# Patient Record
Sex: Male | Born: 1945 | Race: White | Hispanic: No | Marital: Married | Smoking: Former smoker
Health system: Southern US, Community
[De-identification: ages and names within clinical notes are randomized; demographics above are authoritative.]

## PROBLEM LIST (undated history)

## (undated) DIAGNOSIS — R945 Abnormal results of liver function studies: Secondary | ICD-10-CM

## (undated) DIAGNOSIS — E785 Hyperlipidemia, unspecified: Secondary | ICD-10-CM

## (undated) DIAGNOSIS — M542 Cervicalgia: Secondary | ICD-10-CM

## (undated) DIAGNOSIS — S301XXA Contusion of abdominal wall, initial encounter: Secondary | ICD-10-CM

## (undated) DIAGNOSIS — N529 Male erectile dysfunction, unspecified: Secondary | ICD-10-CM

## (undated) DIAGNOSIS — G4733 Obstructive sleep apnea (adult) (pediatric): Secondary | ICD-10-CM

## (undated) DIAGNOSIS — R7989 Other specified abnormal findings of blood chemistry: Secondary | ICD-10-CM

## (undated) DIAGNOSIS — J45909 Unspecified asthma, uncomplicated: Secondary | ICD-10-CM

## (undated) DIAGNOSIS — I701 Atherosclerosis of renal artery: Secondary | ICD-10-CM

## (undated) DIAGNOSIS — I10 Essential (primary) hypertension: Secondary | ICD-10-CM

## (undated) DIAGNOSIS — M1990 Unspecified osteoarthritis, unspecified site: Secondary | ICD-10-CM

## (undated) DIAGNOSIS — S3011XA Contusion of abdominal wall, initial encounter: Secondary | ICD-10-CM

## (undated) HISTORY — PX: RENAL ARTERY STENT: SHX2321

## (undated) HISTORY — DX: Essential (primary) hypertension: I10

## (undated) HISTORY — DX: Contusion of abdominal wall, initial encounter: S30.1XXA

## (undated) HISTORY — DX: Atherosclerosis of renal artery: I70.1

## (undated) HISTORY — DX: Abnormal results of liver function studies: R94.5

## (undated) HISTORY — DX: Contusion of abdominal wall, initial encounter: S30.11XA

## (undated) HISTORY — DX: Unspecified osteoarthritis, unspecified site: M19.90

## (undated) HISTORY — DX: Cervicalgia: M54.2

## (undated) HISTORY — PX: TONSILLECTOMY AND ADENOIDECTOMY: SUR1326

## (undated) HISTORY — DX: Male erectile dysfunction, unspecified: N52.9

## (undated) HISTORY — DX: Other specified abnormal findings of blood chemistry: R79.89

## (undated) HISTORY — DX: Hyperlipidemia, unspecified: E78.5

## (undated) HISTORY — PX: OTHER SURGICAL HISTORY: SHX169

## (undated) HISTORY — DX: Obstructive sleep apnea (adult) (pediatric): G47.33

---

## 2005-09-23 ENCOUNTER — Encounter: Admission: RE | Admit: 2005-09-23 | Discharge: 2005-09-23 | Payer: Self-pay | Admitting: Internal Medicine

## 2006-10-17 ENCOUNTER — Encounter: Admission: RE | Admit: 2006-10-17 | Discharge: 2006-10-17 | Payer: Self-pay | Admitting: Internal Medicine

## 2008-08-09 ENCOUNTER — Ambulatory Visit (HOSPITAL_COMMUNITY): Admission: RE | Admit: 2008-08-09 | Discharge: 2008-08-09 | Payer: Self-pay | Admitting: Emergency Medicine

## 2009-04-02 ENCOUNTER — Ambulatory Visit (HOSPITAL_COMMUNITY): Admission: RE | Admit: 2009-04-02 | Discharge: 2009-04-02 | Payer: Self-pay | Admitting: Interventional Cardiology

## 2010-11-22 LAB — CREATININE, SERUM
Creatinine, Ser: 0.8 mg/dL (ref 0.4–1.5)
GFR calc Af Amer: >60 mL/min (ref >60)
GFR calc non Af Amer: >60 mL/min (ref >60)

## 2010-11-22 LAB — BUN: BUN: 13 mg/dL (ref 6–23)

## 2010-12-21 NOTE — Op Note (Signed)
NAME:  Aaron Kirk, ALA NO.:  [id number]   MEDICAL RECORD NO.:  [id number]          PATIENT TYPE:  AMB   LOCATION:  SDS                          FACILITY:  [facility]   PHYSICIAN:  Corky Crafts, MDDATE OF BIRTH:  [birth year]   DATE OF PROCEDURE:  04/02/2009  DATE OF DISCHARGE:  04/02/2009                               OPERATIVE REPORT   REFERRING PHYSICIAN:  Theressa Millard, MD   PROCEDURES PERFORMED:  Abdominal aortogram, bilateral selective renal  arteriogram, percutaneous transluminal angioplasty/stent of the right  renal artery.   INDICATIONS:  Hypertension, decreasing size of the right kidney.   PROCEDURE NARRATIVE:  The risks and benefits of peripheral angiography  were explained to the patient and informed consent was obtained.  He was  brought to the [facility] lab.  He was prepped and draped in the usual sterile  fashion.  His right groin was infiltrated with 1% lidocaine.  A 6-French  sheath was placed into the right femoral artery using modified Seldinger  technique.  Pigtail catheter was advanced to the abdominal aorta and a  power injection of contrast was performed in the AP projection.  A 6-  [place] IMA guide was used to selectively image the left renal artery  that same guiding catheter was then used to engage the ostium of the  right renal artery.  The right renal artery had an 80% stenosis.  Heparin was given to anticoagulate the patient.  A stabilizer wire was  placed across the lesion.  A 5.0 x 15 Aviator balloon was then inflated  across the lesion to 10 atmospheres for 40 seconds.  There was still a  significant residual stenosis.  A 7.0 x 18-mm Genesis stent was then  placed across the lesion and deployed at 10 atmospheres for 41 seconds.  The ostium of the stent was then flared again to 10 atmospheres for 19  seconds.  The patient did develop some back pain at about 8 atmospheres  and worsened at 10 atmospheres, therefore we did not inflate to  any  higher pressure.  There was no residual stenosis at the end of the  procedure.  There did appeared to be some poststenotic dilatation on the  initial angiogram.  I think this will probably decrease in size.   IMPRESSION:  Left renal artery stenosis, successfully stented with a 7.0  x 18-mm Genesis stent.   RECOMMENDATIONS:  Continue aspirin and Plavix for at least 1 month.  We  will cut Diovan and have to 160 mg a day.  We will plan on letting him  go home later today after bed rest.      Corky Crafts, MD  Electronically Signed     JSV/MEDQ  D:  04/02/2009  T:  04/02/2009  Job:  [id number]

## 2013-05-23 ENCOUNTER — Encounter: Payer: Self-pay | Admitting: Interventional Cardiology

## 2013-05-23 DIAGNOSIS — G4733 Obstructive sleep apnea (adult) (pediatric): Secondary | ICD-10-CM

## 2013-05-23 DIAGNOSIS — E782 Mixed hyperlipidemia: Secondary | ICD-10-CM

## 2013-05-23 DIAGNOSIS — E7800 Pure hypercholesterolemia, unspecified: Secondary | ICD-10-CM

## 2013-05-23 DIAGNOSIS — I10 Essential (primary) hypertension: Secondary | ICD-10-CM

## 2013-05-23 DIAGNOSIS — I701 Atherosclerosis of renal artery: Secondary | ICD-10-CM

## 2013-05-27 ENCOUNTER — Ambulatory Visit (INDEPENDENT_AMBULATORY_CARE_PROVIDER_SITE_OTHER): Payer: BC Managed Care – PPO | Admitting: Interventional Cardiology

## 2013-05-27 ENCOUNTER — Encounter: Payer: Self-pay | Admitting: Interventional Cardiology

## 2013-05-27 ENCOUNTER — Encounter (INDEPENDENT_AMBULATORY_CARE_PROVIDER_SITE_OTHER): Payer: Self-pay

## 2013-05-27 VITALS — BP 126/84 | HR 76 | Ht 69.0 in | Wt 188.0 lb

## 2013-05-27 DIAGNOSIS — N529 Male erectile dysfunction, unspecified: Secondary | ICD-10-CM

## 2013-05-27 DIAGNOSIS — I701 Atherosclerosis of renal artery: Secondary | ICD-10-CM

## 2013-05-27 DIAGNOSIS — I10 Essential (primary) hypertension: Secondary | ICD-10-CM

## 2013-05-27 DIAGNOSIS — E782 Mixed hyperlipidemia: Secondary | ICD-10-CM

## 2013-05-27 MED ORDER — VARDENAFIL HCL 10 MG PO TABS: 10.0000 mg | ORAL_TABLET | Freq: Every day | ORAL | Status: DC | PRN

## 2013-05-27 NOTE — Progress Notes (Signed)
Patient ID: Aaron Kirk, male   DOB: 1945/12/02, 67 y.o.   MRN: [id number]    [address] [place], [place]  [address] Phone: [phone] Fax:  [phone]  Date:  05/27/2013   ID:  Aaron Kirk, DOB 10-12-1945, MRN [id number]  PCP:  No primary provider on file.      History of Present Illness: Aaron Kirk is a 67 y.o. male with renal artery stenosis s/p right renal artery stenting. He had an episode of syncope after standing quickly while in his RV in  In 2013.  No SHOB. He will need foot surgery. He is not playing hockey anymore. Walks daily. Runs several times a week. Rides a bike as well.  He has had to decrease exercise due to foot problems.  No chest pain.  Overall, he feels well. He recently ran a half marathon without any cardiovascular problems. He did reinjure his foot somewhat. He is now taking a break from exercise. He thinks he has put all of the weight since last year to the foot surgery and lack of exercise.    Wt Readings from Last 3 Encounters:  05/27/13 188 lb (85.276 kg)     Past Medical History  Diagnosis Date  . Hypertension   . Renal artery stenosis   . RAS (renal artery stenosis)     s/p right renal artery stent 8/10, 7mm x 18 stent  . RLS (restless legs syndrome)   . Mild obstructive sleep apnea     PSG 7/09 AHI 4/HR, RDI 18/HR, 02 SAT 94 %, resolved with weight loss  . Neck pain     with cervical radiculopathy  . Elevated liver function tests     in past, resolved  . Hyperlipidemia   . Hematoma of abdominal wall     subcutaneous, due to cough 12/09, hockey injury in 2005 as well  . OA (osteoarthritis)     left shoulder    Current Outpatient Prescriptions  Medication Sig Dispense Refill  . aspirin 81 MG tablet Take 81 mg by mouth daily.      Marland Kitchen atorvastatin (LIPITOR) 40 MG tablet Take 40 mg by mouth daily.      . Coenzyme Q10 200 MG capsule Take 200 mg by mouth daily.      . Multiple Vitamin (MULTIVITAMINS PO) Take by  mouth.      . valsartan-hydrochlorothiazide (DIOVAN-HCT) 80-12.5 MG per tablet Take 1 tablet by mouth daily.       No current facility-administered medications for this visit.    Allergies:    Allergies  Allergen Reactions  . Lisinopril     cough    Social History:  The patient  reports that he has quit smoking. He does not have any smokeless tobacco history on file. He reports that he drinks alcohol.   Family History:  The patient's family history includes Breast cancer in his sister; CAD in his sister; Liver cancer in his father; Lung cancer in his mother; Throat cancer in his father.   ROS:  Please see the history of present illness.  No nausea, vomiting.  No fevers, chills.  No focal weakness.  No dysuria.    All other systems reviewed and negative.   PHYSICAL EXAM: VS:  BP 126/84  Pulse 76  Ht 5\' 9"  (1.753 m)  Wt 188 lb (85.276 kg)  BMI 27.75 kg/m2  SpO2 96% Well nourished, well developed, in no acute distress HEENT: normal Neck: no  JVD, no carotid bruits Cardiac:  normal S1, S2; RRR;  Lungs:  clear to auscultation bilaterally, no wheezing, rhonchi or rales Abd: soft, nontender, no hepatomegaly Ext: no edema Skin: warm and dry Neuro:   no focal abnormalities noted  EKG:  Normal     ASSESSMENT AND PLAN:  Essential hypertension, benign  Continue Valsartan-Hydrochlorothiazide Tablet, 80-12.5 MG, half tablet, Orally, Once a day Diagnostic Imaging:EKG Harward,Amy 04/24/2012 02:55:43 PM > Tyia Binford,JAY 04/24/2012 03:05:10 PM > NSR, no ST segment changes  BP has been low.  orthostatic symptoms, resolved on less medicine. BP also better with weight loss. About 2-3 times a month, he may have a systolic reading of 140 and a diastolic of 90. His readings are in the 110/80 range. We'll not change blood pressure medicines at this time. 2. Atherosclerosis of renal artery  Last renal u/s showed widely patent stent, in 2013.  No AAA by prior abdominal imaging.  Will plan on renal  Duplex in 2015 unless BP increases, as this could be issue with previous renal stent.   3. Mixed hyperlipidemia  Continue Atorvastatin Calcium Tablet, 40 MG, 1 tablet, Orally, Once a day LAB: Statin Panel (Ordered for 04/24/2012)  ALP 46 38-126 - U/L   ALT 37 0-52 - U/L   AST 36 0-39 - U/L   CHOLESTEROL 164 <200 - mg/dL   TRIG 48 4-098 - mg/dL   DLDL 82 1-19 - mg/dL   NON-HDL 92 1-478 - mg/dL   DIRECT HDL 71 29-56 - mg/dL H  CHOL/HDL 2.3 2.1-3.0 - Ratio     Xavius Spadafore,JAY 04/27/2012 09:24:09 AM > excellent. COntinue current meds. Please inform and send to Dr. Bonita Quin 04/27/2012 02:00:43 PM > lmom per dpr. To Dr. Earl Gala. OSBORNE,JAMES 04/27/2012 09:55:25 PM >   4. Erectile dysfunction: He did not notice any improvement when he stopped his valsartan for about 2 weeks. Given that he's had a few blood pressure readings in the 140/90 range, I am hesitant to stop any other medications at this time. Will call in prescription for Levitra 10 mg when necessary. Lipids from 3/14 reviewed and well controlled.   F/u in one year  Signed, Fredric Mare, MD, [facility] 05/27/2013 1:58 PM

## 2013-05-27 NOTE — Patient Instructions (Signed)
Your physician wants you to follow-up in: 1 year with Dr. Eldridge Dace. You will receive a reminder letter in the mail two months in advance. If you don't receive a letter, please call our office to schedule the follow-up appointment.  Your physician recommends that you continue on your current medications as directed. Please refer to the Current Medication list given to you today.  Refill for Levitra sent into [facility].

## 2013-06-14 ENCOUNTER — Other Ambulatory Visit: Payer: Self-pay | Admitting: Interventional Cardiology

## 2013-06-17 NOTE — Telephone Encounter (Signed)
Can we refill Levitra for pt?

## 2013-06-21 ENCOUNTER — Other Ambulatory Visit: Payer: Self-pay | Admitting: *Deleted

## 2013-06-21 MED ORDER — SILDENAFIL CITRATE 20 MG PO TABS: 20.0000 mg | ORAL_TABLET | ORAL | Status: DC | PRN

## 2013-06-21 MED ORDER — VARDENAFIL HCL 10 MG PO TABS: 10.0000 mg | ORAL_TABLET | Freq: Every day | ORAL | Status: DC | PRN

## 2013-06-21 NOTE — Telephone Encounter (Signed)
Refilled

## 2013-06-21 NOTE — Telephone Encounter (Signed)
Ok to fill 

## 2013-06-21 NOTE — Telephone Encounter (Signed)
Pharmacy request patients vardenafil be switched to sildenafil for cost. Per Dr Eldridge Dace ok to change. Refill sent in.

## 2013-09-27 ENCOUNTER — Ambulatory Visit
Admission: RE | Admit: 2013-09-27 | Discharge: 2013-09-27 | Disposition: A | Discharge status: Home / Self Care | Payer: No Typology Code available for payment source | Source: Ambulatory Visit | Attending: Internal Medicine | Admitting: Internal Medicine

## 2013-09-27 ENCOUNTER — Other Ambulatory Visit: Payer: Self-pay | Admitting: Internal Medicine

## 2013-09-27 DIAGNOSIS — J209 Acute bronchitis, unspecified: Secondary | ICD-10-CM

## 2013-10-03 ENCOUNTER — Other Ambulatory Visit: Payer: Self-pay | Admitting: Interventional Cardiology

## 2013-10-04 NOTE — Telephone Encounter (Signed)
Ok to refill again? Thanks, MI

## 2013-11-05 ENCOUNTER — Encounter: Payer: Self-pay | Admitting: Interventional Cardiology

## 2014-01-20 ENCOUNTER — Other Ambulatory Visit: Payer: Self-pay | Admitting: Gastroenterology

## 2014-04-15 ENCOUNTER — Encounter (HOSPITAL_COMMUNITY): Payer: Self-pay | Admitting: Pharmacy Technician

## 2014-04-24 ENCOUNTER — Encounter (HOSPITAL_COMMUNITY): Payer: Self-pay | Admitting: *Deleted

## 2014-04-28 ENCOUNTER — Telehealth: Payer: Self-pay | Admitting: Interventional Cardiology

## 2014-04-28 DIAGNOSIS — I701 Atherosclerosis of renal artery: Secondary | ICD-10-CM

## 2014-04-28 DIAGNOSIS — I10 Essential (primary) hypertension: Secondary | ICD-10-CM

## 2014-04-28 NOTE — Telephone Encounter (Signed)
Pt due for annual follow up in October of 2015, is it okay to schedule renal u/s then?

## 2014-04-28 NOTE — Telephone Encounter (Signed)
New message           Pt would like to know if he needs to do a renal arteries [place] this year / please give pt a call

## 2014-04-29 NOTE — Telephone Encounter (Signed)
Ok to schedule renal u/s at that time

## 2014-04-30 NOTE — Telephone Encounter (Signed)
Lmtrc, renal artery u/s ordered.

## 2014-04-30 NOTE — Telephone Encounter (Signed)
Pt notified. Msg sent to [facility] to schedule.

## 2014-06-05 ENCOUNTER — Encounter: Payer: Self-pay | Admitting: Interventional Cardiology

## 2014-06-05 ENCOUNTER — Ambulatory Visit (HOSPITAL_COMMUNITY): Payer: No Typology Code available for payment source | Attending: Cardiology | Admitting: Radiology

## 2014-06-05 ENCOUNTER — Ambulatory Visit (INDEPENDENT_AMBULATORY_CARE_PROVIDER_SITE_OTHER): Payer: No Typology Code available for payment source | Admitting: Interventional Cardiology

## 2014-06-05 VITALS — BP 150/99 | HR 68 | Ht 69.0 in | Wt 194.0 lb

## 2014-06-05 DIAGNOSIS — E7800 Pure hypercholesterolemia, unspecified: Secondary | ICD-10-CM

## 2014-06-05 DIAGNOSIS — E785 Hyperlipidemia, unspecified: Secondary | ICD-10-CM | POA: Diagnosis not present

## 2014-06-05 DIAGNOSIS — I701 Atherosclerosis of renal artery: Secondary | ICD-10-CM | POA: Insufficient documentation

## 2014-06-05 DIAGNOSIS — I10 Essential (primary) hypertension: Secondary | ICD-10-CM | POA: Diagnosis present

## 2014-06-05 DIAGNOSIS — N528 Other male erectile dysfunction: Secondary | ICD-10-CM

## 2014-06-05 MED ORDER — SILDENAFIL CITRATE 20 MG PO TABS: 20.0000 mg | ORAL_TABLET | Freq: Once | ORAL | Status: DC | PRN

## 2014-06-05 NOTE — Progress Notes (Signed)
Patient ID: SRICHARAN LACOMB, male   DOB: Dec 10, 1945, 69 y.o.   MRN: [id number] Patient ID: DEMETRIO LEIGHTY, male   DOB: 1946-05-02, 68 y.o.   MRN: [id number]    [place], [place], [place]  [address] Phone: [phone] Fax:  [phone]  Date:  06/05/2014   ID:  JAKORIAN MARENGO, DOB May 14, 1946, MRN [id number]  PCP:  Horton Finer, MD      History of Present Illness: LAVONTAY KIRK is a 68 y.o. male with renal artery stenosis s/p right renal artery stenting. He had an episode of syncope after standing quickly while in his RV in  In 2013.  No SHOB. He had foot surgery. He is not playing hockey anymore. Walks daily. Runs several times a week. Rides a bike as well.  He does some weights as well.  No chest pain.  Overall, he feels well. He recently ran a half marathon without any cardiovascular problems.   No CP or SHOB.  BP has been controlled. He has decreased his meds and his BP increased, so he restarted the medicine.  Using generic revatio for ED with success.   Wt Readings from Last 3 Encounters:  06/05/14 194 lb (87.998 kg)  05/27/13 188 lb (85.276 kg)     Past Medical History  Diagnosis Date  . Hypertension   . Renal artery stenosis   . RAS (renal artery stenosis)     s/p right renal artery stent 8/10, 36mm x 18 stent  . RLS (restless legs syndrome)   . Mild obstructive sleep apnea     PSG 7/09 AHI 4/HR, RDI 18/HR, 02 SAT 94 %, resolved with weight loss  . Neck pain     with cervical radiculopathy  . Elevated liver function tests     in past, resolved  . Hyperlipidemia   . Hematoma of abdominal wall     subcutaneous, due to cough 12/09, hockey injury in 2005 as well  . OA (osteoarthritis)     left shoulder  . Erectile dysfunction     Current Outpatient Prescriptions  Medication Sig Dispense Refill  . aspirin 81 MG tablet Take 81 mg by mouth every morning.       Marland Kitchen atorvastatin (LIPITOR) 40 MG tablet Take 40 mg by mouth every morning.       .  Coenzyme Q10 200 MG capsule Take 200 mg by mouth every morning.       Marland Kitchen ibuprofen (ADVIL,MOTRIN) 200 MG tablet Take 400-600 mg by mouth once as needed for moderate pain.      . Multiple Vitamin (MULTIVITAMINS PO) Take by mouth.      . sildenafil (REVATIO) 20 MG tablet Take 20 mg by mouth once as needed (erectile dysfunction.).      Marland Kitchen valsartan-hydrochlorothiazide (DIOVAN-HCT) 80-12.5 MG per tablet Take 1 tablet by mouth every morning.       . vardenafil (LEVITRA) 10 MG tablet Take 1 tablet (10 mg total) by mouth daily as needed for erectile dysfunction.  10 tablet  0   No current facility-administered medications for this visit.    Allergies:    Allergies  Allergen Reactions  . Lisinopril     cough    Social History:  The patient  reports that he quit smoking about 35 years ago. He does not have any smokeless tobacco history on file. He reports that he drinks alcohol. He reports that he does not use illicit drugs.   Family History:  The patient's family history includes Breast cancer in his sister; CAD in his sister; Cancer in his father and mother; Diabetes in his father; Heart attack in his mother; Hypertension in his father and mother; Liver cancer in his father; Lung cancer in his mother; Throat cancer in his father. There is no history of Stroke.   ROS:  Please see the history of present illness.  No nausea, vomiting.  No fevers, chills.  No focal weakness.  No dysuria.    All other systems reviewed and negative.   PHYSICAL EXAM: VS:  BP 150/99  Pulse 68  Ht 5\' 9"  (1.753 m)  Wt 194 lb (87.998 kg)  BMI 28.64 kg/m2  SpO2 99% Well nourished, well developed, in no acute distress HEENT: normal Neck: no JVD, no carotid bruits Cardiac:  normal S1, S2; RRR;  Lungs:  clear to auscultation bilaterally, no wheezing, rhonchi or rales Abd: soft, nontender, no hepatomegaly Ext: no edema Skin: warm and dry Neuro:   no focal abnormalities noted  EKG:  Normal     ASSESSMENT AND  PLAN:  Essential hypertension, benign  Continue Valsartan-Hydrochlorothiazide Tablet, 80-12.5 MG, full tablet, Orally, Once a day Diagnostic Imaging:EKG Harward,Amy 04/24/2012 02:55:43 PM > Pachia Strum,JAY 04/24/2012 03:05:10 PM > NSR, no ST segment changes  BP has been controlled at home.  orthostatic symptoms, resolved on current medicine. BP also better with weight loss. About 2-3 times a month, he may have a systolic reading of 785 and a diastolic of 90. His readings are in the 110/80 range. We'll not change blood pressure medicines at this time. 2. Atherosclerosis of renal artery  Last renal u/s showed widely patent stent, in 2013.  No AAA by prior abdominal imaging.  Renal Duplex done today. 3. Mixed hyperlipidemia  Continue Atorvastatin Calcium Tablet, 40 MG, 1 tablet, Orally, Once a day LAB: Statin Panel 04/24/2012)  ALP 46 38-126 - U/L   ALT 37 0-52 - U/L   AST 36 0-39 - U/L   CHOLESTEROL 164 <200 - mg/dL   TRIG 48 0-199 - mg/dL   DLDL 82 0-99 - mg/dL   NON-HDL 92 0-129 - mg/dL   DIRECT HDL 71 30-70 - mg/dL H  CHOL/HDL 2.3 2.0-4.0 - Ratio     3/15: LDL 70; HDL 61   4. Erectile dysfunction: He did not notice any improvement when he stopped his valsartan for about 2 weeks in the past. Given that he's had a few blood pressure readings in the 140/90 range, I am hesitant to stop any other medications at this time. Stop Levitra.  Will refill Revatio for ED.   Lipids from 3/15 reviewed and well controlled.   F/u as needed if renal duplex ok.  Signed, Mina Marble, MD, [facility] 06/05/2014 9:20 AM

## 2014-06-05 NOTE — Progress Notes (Signed)
Renal artery Duplex performed  

## 2014-06-05 NOTE — Patient Instructions (Addendum)
Your physician recommends that you continue on your current medications as directed. Please refer to the Current Medication list given to you today.  DR Irish Lack REFILLED YOUR REVATIO WITH 6 REFILLS AS REQUESTED   Your physician recommends that you schedule a follow-up appointment in: AS NEEDED WITH DR Irish Lack

## 2014-06-11 ENCOUNTER — Telehealth: Payer: Self-pay | Admitting: Interventional Cardiology

## 2014-06-11 NOTE — Telephone Encounter (Signed)
Follow up     Patient calling stating someone call him today returning call back to nurse

## 2014-06-11 NOTE — Telephone Encounter (Signed)
I spoke with the patient. 

## 2014-10-07 ENCOUNTER — Other Ambulatory Visit: Payer: Self-pay | Admitting: Gastroenterology

## 2014-10-22 ENCOUNTER — Encounter (HOSPITAL_COMMUNITY): Payer: Self-pay | Admitting: *Deleted

## 2014-10-27 NOTE — Anesthesia Preprocedure Evaluation (Signed)
Anesthesia Evaluation  Patient identified by MRN, date of birth, ID band Patient awake    Reviewed: Allergy & Precautions, NPO status , Patient's Chart, lab work & pertinent test results  History of Anesthesia Complications Negative for: history of anesthetic complications  Airway Mallampati: II  TM Distance: >3 FB Neck ROM: Full    Dental no notable dental hx.    Pulmonary sleep apnea , former smoker,  breath sounds clear to auscultation  Pulmonary exam normal       Cardiovascular hypertension, Pt. on medications + Peripheral Vascular Disease Rhythm:Regular Rate:Normal     Neuro/Psych negative neurological ROS  negative psych ROS   GI/Hepatic negative GI ROS, Neg liver ROS,   Endo/Other  negative endocrine ROS  Renal/GU Renal diseaseRenal artery stenosis s/p stenting  negative genitourinary   Musculoskeletal  (+) Arthritis -, Osteoarthritis,    Abdominal   Peds negative pediatric ROS (+)  Hematology negative hematology ROS (+)   Anesthesia Other Findings   Reproductive/Obstetrics negative OB ROS                             Anesthesia Physical Anesthesia Plan  ASA: II  Anesthesia Plan: MAC   Post-op Pain Management:    Induction: Intravenous  Airway Management Planned: Nasal Cannula  Additional Equipment:   Intra-op Plan:   Post-operative Plan:   Informed Consent: I have reviewed the patients History and Physical, chart, labs and discussed the procedure including the risks, benefits and alternatives for the proposed anesthesia with the patient or authorized representative who has indicated his/her understanding and acceptance.   Dental advisory given  Plan Discussed with: CRNA  Anesthesia Plan Comments:         Anesthesia Quick Evaluation

## 2014-10-28 ENCOUNTER — Encounter (HOSPITAL_COMMUNITY)
Admission: RE | Disposition: A | Discharge status: Home / Self Care | Payer: Self-pay | Source: Ambulatory Visit | Attending: Gastroenterology

## 2014-10-28 ENCOUNTER — Ambulatory Visit (HOSPITAL_COMMUNITY): Payer: No Typology Code available for payment source | Admitting: Anesthesiology

## 2014-10-28 ENCOUNTER — Encounter (HOSPITAL_COMMUNITY): Payer: Self-pay | Admitting: Certified Registered"

## 2014-10-28 ENCOUNTER — Ambulatory Visit (HOSPITAL_COMMUNITY)
Admission: RE | Admit: 2014-10-28 | Discharge: 2014-10-28 | Disposition: A | Discharge status: Home / Self Care | Payer: No Typology Code available for payment source | Source: Ambulatory Visit | Attending: Gastroenterology | Admitting: Gastroenterology

## 2014-10-28 DIAGNOSIS — I701 Atherosclerosis of renal artery: Secondary | ICD-10-CM | POA: Diagnosis not present

## 2014-10-28 DIAGNOSIS — I739 Peripheral vascular disease, unspecified: Secondary | ICD-10-CM | POA: Diagnosis not present

## 2014-10-28 DIAGNOSIS — M1990 Unspecified osteoarthritis, unspecified site: Secondary | ICD-10-CM | POA: Insufficient documentation

## 2014-10-28 DIAGNOSIS — D123 Benign neoplasm of transverse colon: Secondary | ICD-10-CM | POA: Insufficient documentation

## 2014-10-28 DIAGNOSIS — Z1211 Encounter for screening for malignant neoplasm of colon: Secondary | ICD-10-CM | POA: Insufficient documentation

## 2014-10-28 DIAGNOSIS — G4730 Sleep apnea, unspecified: Secondary | ICD-10-CM | POA: Diagnosis not present

## 2014-10-28 DIAGNOSIS — Z7982 Long term (current) use of aspirin: Secondary | ICD-10-CM | POA: Insufficient documentation

## 2014-10-28 DIAGNOSIS — E785 Hyperlipidemia, unspecified: Secondary | ICD-10-CM | POA: Insufficient documentation

## 2014-10-28 DIAGNOSIS — K5730 Diverticulosis of large intestine without perforation or abscess without bleeding: Secondary | ICD-10-CM | POA: Insufficient documentation

## 2014-10-28 DIAGNOSIS — Z87891 Personal history of nicotine dependence: Secondary | ICD-10-CM | POA: Insufficient documentation

## 2014-10-28 DIAGNOSIS — I10 Essential (primary) hypertension: Secondary | ICD-10-CM | POA: Insufficient documentation

## 2014-10-28 HISTORY — PX: COLONOSCOPY WITH PROPOFOL: SHX5780

## 2014-10-28 SURGERY — COLONOSCOPY WITH PROPOFOL
Anesthesia: Monitor Anesthesia Care

## 2014-10-28 MED ORDER — PROPOFOL 10 MG/ML IV BOLUS
INTRAVENOUS | Status: AC
  Filled 2014-10-28: qty 20

## 2014-10-28 MED ORDER — LACTATED RINGERS IV SOLN
INTRAVENOUS | Status: DC | PRN
  Administered 2014-10-28: 09:00:00 via INTRAVENOUS

## 2014-10-28 MED ORDER — LIDOCAINE HCL (CARDIAC) 20 MG/ML IV SOLN
INTRAVENOUS | Status: AC
  Filled 2014-10-28: qty 5

## 2014-10-28 MED ORDER — PROPOFOL 10 MG/ML IV BOLUS
INTRAVENOUS | Status: DC | PRN
  Administered 2014-10-28: 70 mg via INTRAVENOUS
  Administered 2014-10-28: 20 mg via INTRAVENOUS

## 2014-10-28 MED ORDER — LIDOCAINE HCL (CARDIAC) 20 MG/ML IV SOLN
INTRAVENOUS | Status: DC | PRN
  Administered 2014-10-28: 50 mg via INTRAVENOUS

## 2014-10-28 MED ORDER — PROPOFOL INFUSION 10 MG/ML OPTIME
INTRAVENOUS | Status: DC | PRN
  Administered 2014-10-28: 140 ug/kg/min via INTRAVENOUS

## 2014-10-28 SURGICAL SUPPLY — 21 items

## 2014-10-28 NOTE — H&P (Signed)
  Procedure: Screening colonoscopy. Normal screening colonoscopy performed on 02/18/2004  History: The patient is a 69 year old male born 1946-06-12. He is scheduled to undergo a repeat screening colonoscopy today.  Past medical history: Tonsillectomy. Plantar fibroma resection in 2013. Hypertension. Right renal artery stenosis treated with renal artery stent. Restless leg syndrome. Osteoarthritis. Hypercholesterolemia.  Medication allergies: Lisinopril causes cough. Azithromycin causes diarrhea.  Exam: The patient is alert and lying comfortably on the endoscopy stretcher. Abdomen is soft and nontender to palpation. Lungs are clear to auscultation. Cardiac exam reveals a regular rhythm.  Plan: Proceed with screening colonoscopy

## 2014-10-28 NOTE — Anesthesia Postprocedure Evaluation (Signed)
  Anesthesia Post-op Note  Patient: Aaron Kirk  Procedure(s) Performed: Procedure(s) (LRB): COLONOSCOPY WITH PROPOFOL (N/A)  Patient Location: PACU  Anesthesia Type: MAC  Level of Consciousness: awake and alert   Airway and Oxygen Therapy: Patient Spontanous Breathing  Post-op Pain: mild  Post-op Assessment: Post-op Vital signs reviewed, Patient's Cardiovascular Status Stable, Respiratory Function Stable, Patent Airway and No signs of Nausea or vomiting  Last Vitals:  Filed Vitals:   10/28/14 1030  BP: 148/80  Pulse: 57  Temp:   Resp: 12    Post-op Vital Signs: stable   Complications: No apparent anesthesia complications

## 2014-10-28 NOTE — Transfer of Care (Signed)
Immediate Anesthesia Transfer of Care Note  Patient: Aaron Kirk  Procedure(s) Performed: Procedure(s): COLONOSCOPY WITH PROPOFOL (N/A)  Patient Location: PACU  Anesthesia Type:MAC  Level of Consciousness: awake, alert  and oriented  Airway & Oxygen Therapy: Patient Spontanous Breathing and Patient connected to face mask oxygen  Post-op Assessment: Report given to RN and Post -op Vital signs reviewed and stable  Post vital signs: Reviewed and stable  Last Vitals: There were no vitals filed for this visit.  Complications: No apparent anesthesia complications

## 2014-10-28 NOTE — Op Note (Signed)
Procedure: Screening colonoscopy. Normal screening colonoscopy performed on 02/18/2004  Endoscopist: Earle Gell  Premedication: Propofol administered by anesthesia  Procedure: The patient was placed in the left lateral decubitus position. Anal inspection and digital rectal exam were normal. The Pentax pediatric colonoscope was introduced into the rectum and advanced to the cecum. A normal-appearing appendiceal orifice and ileocecal valve were identified. Colonic preparation was good from the rectum to the distal ascending colon. A thick film coating the cecum and ascending colon which could not be completely washed away. Flat polyps could easily have been missed in the cecum and ascending colon. Withdrawal time was 24 minutes.  Rectum. Normal. Retroflexed view of the distal rectum normal  Sigmoid colon and descending colon. Left colonic diverticulosis  Splenic flexure. Normal  Transverse colon. Colonic diverticulosis. From the mid transverse colon a 4 mm sessile polyp was removed in piecemeal fashion with the cold biopsy forceps  Hepatic flexure. Normal  Ascending colon. Unsatisfactory colonic preparation to identify flat polyps. What could be visualized of the colonic mucosa appeared normal  Cecum and ileocecal valve. Unsatisfactory colonic preparation for identify flat polyps. What could be visualized colonic mucosa appeared normal  Assessment:  #1. Unsatisfactory colonic preparation in the cecum and ascending colon to identify flat polyps  #2. Colonic diverticulosis involving the transverse colon and left colon  #3. A small polyp was removed from the mid transverse colon with the cold biopsy forceps  Recommendation: Schedule repeat screening colonoscopy in approximately one year using a split dose colonic lavage prep to ensure adequate in the right.

## 2014-10-28 NOTE — Discharge Instructions (Signed)

## 2014-10-29 ENCOUNTER — Encounter (HOSPITAL_COMMUNITY): Payer: Self-pay | Admitting: Gastroenterology

## 2014-11-11 ENCOUNTER — Other Ambulatory Visit: Payer: Self-pay | Admitting: Gastroenterology

## 2015-02-02 ENCOUNTER — Other Ambulatory Visit: Payer: Self-pay

## 2015-06-04 ENCOUNTER — Other Ambulatory Visit: Payer: Self-pay | Admitting: Gastroenterology

## 2015-06-23 ENCOUNTER — Encounter (HOSPITAL_COMMUNITY): Payer: Self-pay

## 2015-06-23 ENCOUNTER — Ambulatory Visit (HOSPITAL_COMMUNITY): Admit: 2015-06-23 | Payer: Self-pay | Admitting: Gastroenterology

## 2015-06-23 SURGERY — COLONOSCOPY WITH PROPOFOL
Anesthesia: Monitor Anesthesia Care

## 2015-07-20 ENCOUNTER — Encounter (HOSPITAL_COMMUNITY): Payer: Self-pay | Admitting: *Deleted

## 2015-07-28 ENCOUNTER — Ambulatory Visit (HOSPITAL_COMMUNITY)
Admission: RE | Admit: 2015-07-28 | Discharge: 2015-07-28 | Disposition: A | Discharge status: Home / Self Care | Payer: BLUE CROSS/BLUE SHIELD | Source: Ambulatory Visit | Attending: Gastroenterology | Admitting: Gastroenterology

## 2015-07-28 ENCOUNTER — Ambulatory Visit (HOSPITAL_COMMUNITY): Payer: BLUE CROSS/BLUE SHIELD | Admitting: Anesthesiology

## 2015-07-28 ENCOUNTER — Encounter (HOSPITAL_COMMUNITY)
Admission: RE | Disposition: A | Discharge status: Home / Self Care | Payer: Self-pay | Source: Ambulatory Visit | Attending: Gastroenterology

## 2015-07-28 ENCOUNTER — Encounter (HOSPITAL_COMMUNITY): Payer: Self-pay | Admitting: *Deleted

## 2015-07-28 DIAGNOSIS — Z1211 Encounter for screening for malignant neoplasm of colon: Secondary | ICD-10-CM | POA: Diagnosis not present

## 2015-07-28 DIAGNOSIS — I10 Essential (primary) hypertension: Secondary | ICD-10-CM | POA: Insufficient documentation

## 2015-07-28 DIAGNOSIS — Z86010 Personal history of colonic polyps: Secondary | ICD-10-CM | POA: Diagnosis not present

## 2015-07-28 DIAGNOSIS — G4730 Sleep apnea, unspecified: Secondary | ICD-10-CM | POA: Diagnosis not present

## 2015-07-28 DIAGNOSIS — I701 Atherosclerosis of renal artery: Secondary | ICD-10-CM | POA: Diagnosis not present

## 2015-07-28 DIAGNOSIS — Z95828 Presence of other vascular implants and grafts: Secondary | ICD-10-CM | POA: Diagnosis not present

## 2015-07-28 DIAGNOSIS — E7800 Pure hypercholesterolemia, unspecified: Secondary | ICD-10-CM | POA: Diagnosis not present

## 2015-07-28 DIAGNOSIS — G4733 Obstructive sleep apnea (adult) (pediatric): Secondary | ICD-10-CM | POA: Insufficient documentation

## 2015-07-28 DIAGNOSIS — M1990 Unspecified osteoarthritis, unspecified site: Secondary | ICD-10-CM | POA: Diagnosis not present

## 2015-07-28 DIAGNOSIS — I739 Peripheral vascular disease, unspecified: Secondary | ICD-10-CM | POA: Insufficient documentation

## 2015-07-28 DIAGNOSIS — Z87891 Personal history of nicotine dependence: Secondary | ICD-10-CM | POA: Insufficient documentation

## 2015-07-28 DIAGNOSIS — M19012 Primary osteoarthritis, left shoulder: Secondary | ICD-10-CM | POA: Diagnosis not present

## 2015-07-28 DIAGNOSIS — M19011 Primary osteoarthritis, right shoulder: Secondary | ICD-10-CM | POA: Diagnosis not present

## 2015-07-28 DIAGNOSIS — K5730 Diverticulosis of large intestine without perforation or abscess without bleeding: Secondary | ICD-10-CM | POA: Diagnosis not present

## 2015-07-28 HISTORY — PX: COLONOSCOPY WITH PROPOFOL: SHX5780

## 2015-07-28 SURGERY — COLONOSCOPY WITH PROPOFOL
Anesthesia: Monitor Anesthesia Care

## 2015-07-28 MED ORDER — PROPOFOL 10 MG/ML IV BOLUS
INTRAVENOUS | Status: DC | PRN
  Administered 2015-07-28: 40 mg via INTRAVENOUS
  Administered 2015-07-28: 20 mg via INTRAVENOUS
  Administered 2015-07-28: 40 mg via INTRAVENOUS
  Administered 2015-07-28 (×9): 20 mg via INTRAVENOUS

## 2015-07-28 MED ORDER — LACTATED RINGERS IV SOLN
INTRAVENOUS | Status: DC
Start: 2015-07-28 — End: 2015-07-28
  Administered 2015-07-28: 1000 mL via INTRAVENOUS

## 2015-07-28 MED ORDER — SODIUM CHLORIDE 0.9 % IV SOLN: INTRAVENOUS | Status: DC

## 2015-07-28 MED ORDER — PROPOFOL 10 MG/ML IV BOLUS
INTRAVENOUS | Status: AC
  Filled 2015-07-28: qty 40

## 2015-07-28 SURGICAL SUPPLY — 22 items

## 2015-07-28 NOTE — H&P (Signed)
  Procedure: Surveillance colonoscopy. History of adenomatous colon polyp removed colonoscopically in the past  History: The patient is a 69 year old male born 1946-07-12. He is scheduled to undergo a surveillance colonoscopy today.  Medication allergies: Lisinopril causes cough. Azithromycin caused diarrhea  Past medical history: Tonsillectomy. Obstructive sleep apnea syndrome. I retention. Right renal artery stent placed to treat renal artery stenosis. Hypercholesterolemia. Osteoarthritis of the shoulders. He give airways disease. Hypercholesterolemia. Hypertension. Atherosclerosis of the renal artery.  Exam: Patient is alert and lying comfortably on the endoscopy stretcher. Abdomen is soft and nontender to palpation. Lungs are clear to auscultation. Cardiac exam reveals a regular rhythm.  Plan: Proceed with surveillance colonoscopy

## 2015-07-28 NOTE — Transfer of Care (Signed)
Immediate Anesthesia Transfer of Care Note  Patient: Aaron Kirk  Procedure(s) Performed: Procedure(s): COLONOSCOPY WITH PROPOFOL (N/A)  Patient Location: PACU and Endoscopy Unit  Anesthesia Type:MAC  Level of Consciousness: awake, alert , oriented and patient cooperative  Airway & Oxygen Therapy: Patient Spontanous Breathing and Patient connected to face mask oxygen  Post-op Assessment: Report given to RN, Post -op Vital signs reviewed and stable and Patient moving all extremities  Post vital signs: Reviewed and stable  Last Vitals:  Filed Vitals:   07/28/15 1252 07/28/15 1432  BP: 166/92 140/97  Pulse: 63 70  Temp: 36.9 C 36.7 C  Resp: 12 14    Complications: No apparent anesthesia complications

## 2015-07-28 NOTE — Op Note (Signed)
Procedure: Surveillance colonoscopy. 02/18/2004 normal screening colonoscopy performed. 10/28/2014 colonoscopy performed with removal of a 4 mm tubular adenomatous transverse colon polyp. Unsatisfactory colon prep in the cecum and ascending colon.  Endoscopist: Earle Gell  Premedication: Propofol administered by anesthesia  Procedure: The patient was placed in the left lateral decubitus position. Anal inspection and digital rectal exam were normal. The Pentax pediatric colonoscope was introduced into the rectum and advanced to the cecum. A normal-appearing appendiceal orifice and ileocecal valve were identified. Colonic preparation for the exam today was good. Withdrawal time was 9 minutes  Rectum. Normal. Retroflexed view of the distal rectum was normal  Sigmoid colon and descending colon. Colonic diverticulosis  Splenic flexure. Normal  Transverse colon. Colonic diverticulosis. Hepatic flexure. Normal  Ascending colon. Normal  Cecum and ileocecal valve. Normal  Assessment: Normal surveillance colonoscopy.

## 2015-07-28 NOTE — Discharge Instructions (Signed)

## 2015-07-28 NOTE — Anesthesia Postprocedure Evaluation (Signed)
Anesthesia Post Note  Patient: Aaron Kirk  Procedure(s) Performed: Procedure(s) (LRB): COLONOSCOPY WITH PROPOFOL (N/A)  Patient location during evaluation: PACU Anesthesia Type: MAC Level of consciousness: awake and alert Pain management: pain level controlled Vital Signs Assessment: post-procedure vital signs reviewed and stable Respiratory status: spontaneous breathing, nonlabored ventilation, respiratory function stable and patient connected to nasal cannula oxygen Cardiovascular status: blood pressure returned to baseline and stable Postop Assessment: no signs of nausea or vomiting Anesthetic complications: no    Last Vitals:  Filed Vitals:   07/28/15 1252 07/28/15 1432  BP: 166/92 140/97  Pulse: 63 70  Temp: 36.9 C 36.7 C  Resp: 12 14    Last Pain: There were no vitals filed for this visit.               Tajay Muzzy JENNETTE

## 2015-07-28 NOTE — Anesthesia Preprocedure Evaluation (Signed)
Anesthesia Evaluation  Patient identified by MRN, date of birth, ID band Patient awake    Reviewed: Allergy & Precautions, NPO status , Patient's Chart, lab work & pertinent test results  History of Anesthesia Complications Negative for: history of anesthetic complications  Airway Mallampati: II  TM Distance: >3 FB Neck ROM: Full    Dental no notable dental hx. (+) Dental Advisory Given   Pulmonary sleep apnea , former smoker,    Pulmonary exam normal breath sounds clear to auscultation       Cardiovascular hypertension, Pt. on medications + Peripheral Vascular Disease  Normal cardiovascular exam Rhythm:Regular Rate:Normal     Neuro/Psych negative neurological ROS  negative psych ROS   GI/Hepatic negative GI ROS, Neg liver ROS,   Endo/Other  negative endocrine ROS  Renal/GU Renal disease  negative genitourinary   Musculoskeletal  (+) Arthritis ,   Abdominal   Peds negative pediatric ROS (+)  Hematology negative hematology ROS (+)   Anesthesia Other Findings   Reproductive/Obstetrics negative OB ROS                             Anesthesia Physical Anesthesia Plan  ASA: II  Anesthesia Plan: MAC   Post-op Pain Management:    Induction: Intravenous  Airway Management Planned: Nasal Cannula  Additional Equipment:   Intra-op Plan:   Post-operative Plan:   Informed Consent: I have reviewed the patients History and Physical, chart, labs and discussed the procedure including the risks, benefits and alternatives for the proposed anesthesia with the patient or authorized representative who has indicated his/her understanding and acceptance.   Dental advisory given  Plan Discussed with: CRNA  Anesthesia Plan Comments:         Anesthesia Quick Evaluation

## 2015-07-29 ENCOUNTER — Encounter (HOSPITAL_COMMUNITY): Payer: Self-pay | Admitting: Gastroenterology

## 2015-11-02 ENCOUNTER — Other Ambulatory Visit: Payer: Self-pay | Admitting: Internal Medicine

## 2015-11-02 ENCOUNTER — Ambulatory Visit
Admission: RE | Admit: 2015-11-02 | Discharge: 2015-11-02 | Disposition: A | Discharge status: Home / Self Care | Payer: BLUE CROSS/BLUE SHIELD | Source: Ambulatory Visit | Attending: Internal Medicine | Admitting: Internal Medicine

## 2015-11-02 DIAGNOSIS — J209 Acute bronchitis, unspecified: Secondary | ICD-10-CM

## 2015-11-12 ENCOUNTER — Other Ambulatory Visit: Payer: Self-pay | Admitting: Internal Medicine

## 2015-11-12 DIAGNOSIS — D61818 Other pancytopenia: Secondary | ICD-10-CM

## 2015-11-20 ENCOUNTER — Other Ambulatory Visit: Payer: BLUE CROSS/BLUE SHIELD

## 2015-11-23 ENCOUNTER — Ambulatory Visit
Admission: RE | Admit: 2015-11-23 | Discharge: 2015-11-23 | Disposition: A | Discharge status: Home / Self Care | Payer: BLUE CROSS/BLUE SHIELD | Source: Ambulatory Visit | Attending: Internal Medicine | Admitting: Internal Medicine

## 2015-11-23 DIAGNOSIS — D61818 Other pancytopenia: Secondary | ICD-10-CM

## 2016-08-12 ENCOUNTER — Other Ambulatory Visit: Payer: Self-pay | Admitting: Interventional Cardiology

## 2016-08-12 NOTE — Telephone Encounter (Signed)
The pt sees Dr Irish Lack as needed and has not had a f/u since 06/05/14. Please refer to the pts PCP. Thanks.

## 2016-09-07 ENCOUNTER — Other Ambulatory Visit: Payer: Self-pay | Admitting: Interventional Cardiology

## 2016-09-07 NOTE — Telephone Encounter (Signed)
Refill   08/12/2016 [place]  Jettie Booze, MD  Cardiology   Medication Refill  Reason for call   Conversation: Medication Refill  (Newest Message First)        08/12/16 1:25 PM  Deliah [place] Via, LPN routed this conversation to [facility] [employer] Refill . Juventino Slovak, CMA        08/12/16 12:43 PM  Mindy Braxton Feathers, CMA routed this conversation to [place], LPN        [id number] PM  Deliah [place] Via, LPN routed this conversation to [facility] [employer] Refill . Juventino Slovak, [place] Via, LPN      [id number] AM  Note    The pt sees Dr Irish Lack as needed and has not had a f/u since 06/05/14. Please refer to the pts PCP. Thanks.

## 2016-10-28 ENCOUNTER — Other Ambulatory Visit: Payer: Self-pay | Admitting: Internal Medicine

## 2016-10-28 DIAGNOSIS — R42 Dizziness and giddiness: Secondary | ICD-10-CM

## 2016-11-04 ENCOUNTER — Other Ambulatory Visit: Payer: BLUE CROSS/BLUE SHIELD

## 2016-11-23 ENCOUNTER — Ambulatory Visit
Admission: RE | Admit: 2016-11-23 | Discharge: 2016-11-23 | Disposition: A | Discharge status: Home / Self Care | Payer: BLUE CROSS/BLUE SHIELD | Source: Ambulatory Visit | Attending: Internal Medicine | Admitting: Internal Medicine

## 2016-11-23 DIAGNOSIS — R42 Dizziness and giddiness: Secondary | ICD-10-CM

## 2017-10-06 IMAGING — CR DG CHEST 2V
2 series · 2 of 2 positions shown · non-contrast
Comparison: 09/27/2013.

CLINICAL DATA: Bronchitis.

EXAM:
CHEST  2 VIEW

[view not recorded (1 of 2)]
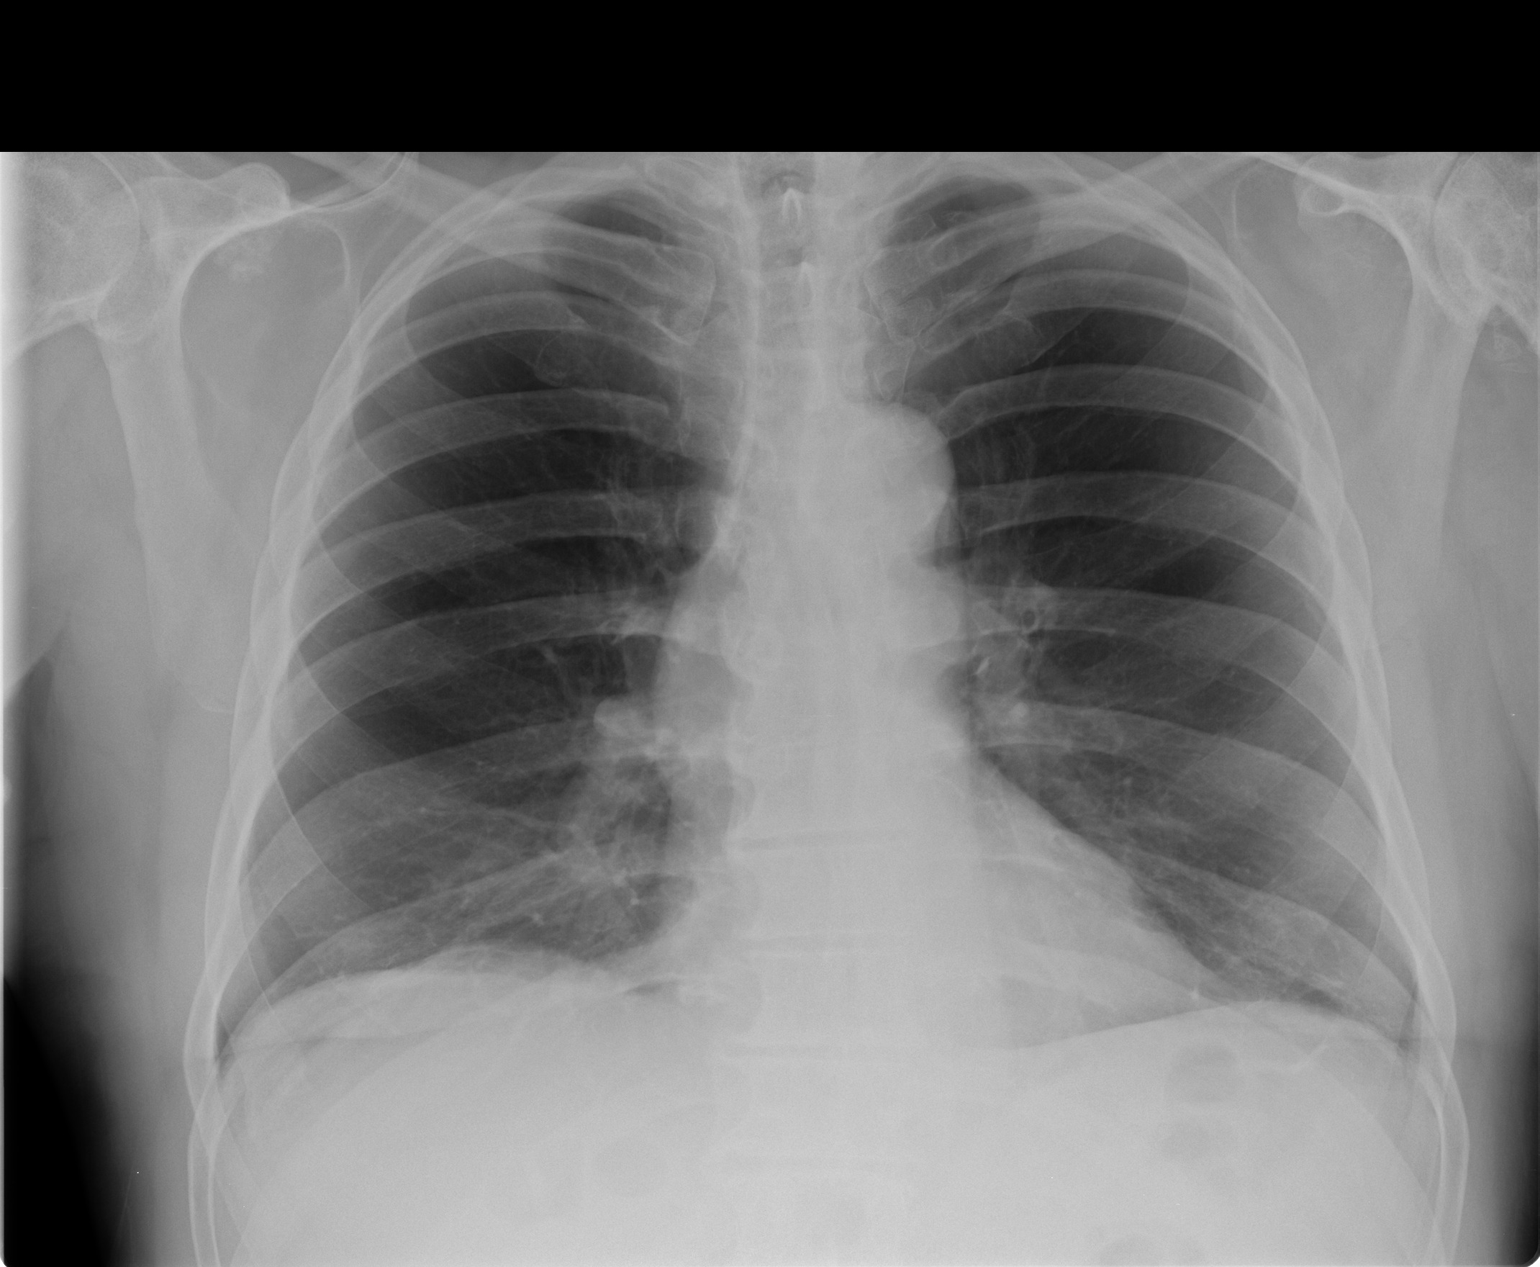

[view not recorded (2 of 2)]
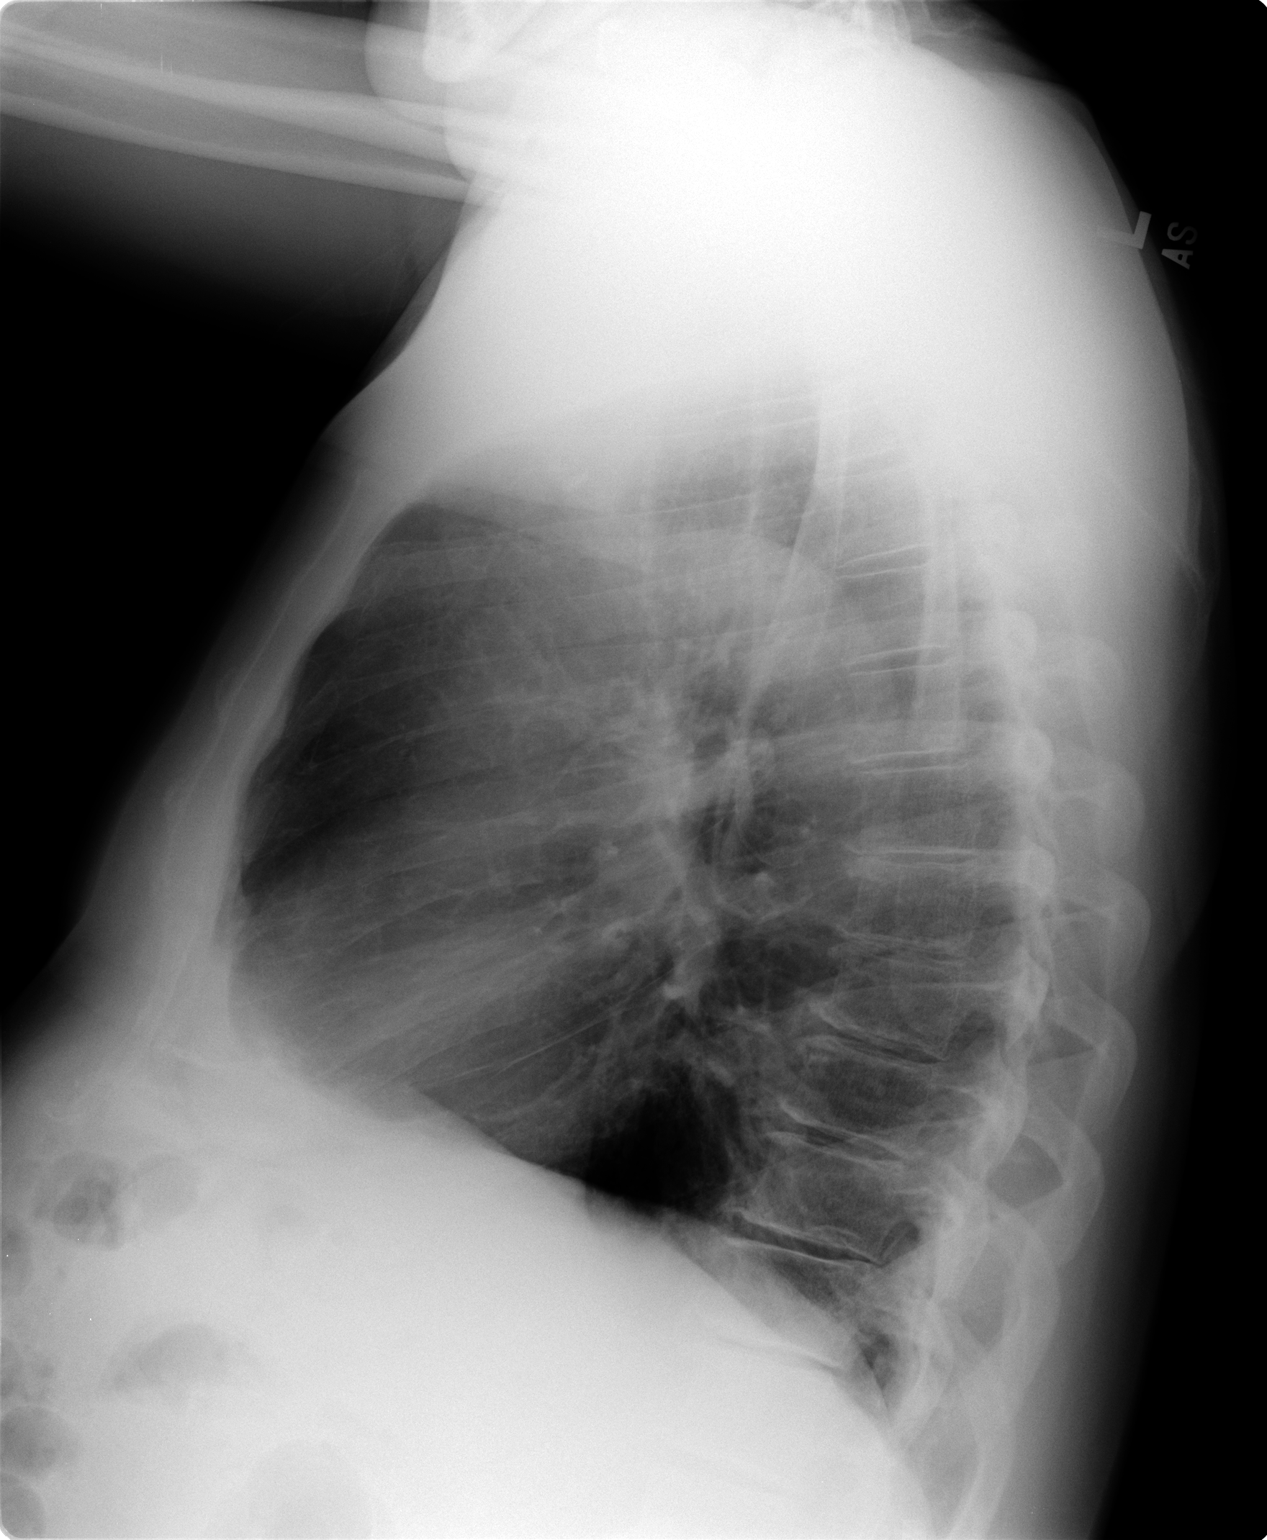

[2 of 2 positions shown; findings below may reference images not displayed]

FINDINGS: Mediastinum hilar structures normal. Low lung volumes with mild
bibasilar atelectasis and/or scarring. No pleural effusion
pneumothorax. Heart size normal. Degenerative changes thoracic spine
.
IMPRESSION: Low lung volumes with mild bibasilar atelectasis and/or pleural
parenchymal scarring. No acute abnormality. Chest is stable from
prior study.

## 2018-05-22 ENCOUNTER — Other Ambulatory Visit: Payer: Self-pay | Admitting: Internal Medicine

## 2018-05-22 ENCOUNTER — Ambulatory Visit
Admission: RE | Admit: 2018-05-22 | Discharge: 2018-05-22 | Disposition: A | Discharge status: Home / Self Care | Payer: BLUE CROSS/BLUE SHIELD | Source: Ambulatory Visit | Attending: Internal Medicine | Admitting: Internal Medicine

## 2018-05-22 DIAGNOSIS — M545 Low back pain, unspecified: Secondary | ICD-10-CM

## 2019-09-15 ENCOUNTER — Ambulatory Visit: Payer: BC Managed Care – PPO | Attending: Internal Medicine

## 2019-09-15 DIAGNOSIS — Z23 Encounter for immunization: Secondary | ICD-10-CM | POA: Insufficient documentation

## 2019-09-15 NOTE — Progress Notes (Signed)
   Covid-19 Vaccination Clinic  Name:  Aaron Kirk    MRN: [id number] DOB: 20-May-1946  09/15/2019  [name] was observed post Covid-19 immunization for 15 minutes without incidence. He was provided with Vaccine Information Sheet and instruction to access the V-Safe system.   [name] was instructed to call 911 with any severe reactions post vaccine: Marland Kitchen Difficulty breathing  . Swelling of your face and throat  . A fast heartbeat  . A bad rash all over your body  . Dizziness and weakness    Immunizations Administered    Name Date Dose VIS Date Route   Pfizer COVID-19 Vaccine 09/15/2019  4:23 PM 0.3 mL 07/19/2019 Intramuscular   Manufacturer: [place]   Lot: [id number]   [place]: [id number]

## 2019-10-01 ENCOUNTER — Ambulatory Visit: Payer: BLUE CROSS/BLUE SHIELD

## 2019-10-10 ENCOUNTER — Ambulatory Visit: Payer: BC Managed Care – PPO | Attending: Internal Medicine

## 2019-10-10 DIAGNOSIS — Z23 Encounter for immunization: Secondary | ICD-10-CM

## 2019-10-10 NOTE — Progress Notes (Signed)
   Covid-19 Vaccination Clinic  Name:  Aaron Kirk    MRN: [id number] DOB: 03-17-1946  10/10/2019  Aaron Kirk was observed post Covid-19 immunization for 15 minutes without incident. He was provided with Vaccine Information Sheet and instruction to access the V-Safe system.   Aaron Kirk was instructed to call 911 with any severe reactions post vaccine: Marland Kitchen Difficulty breathing  . Swelling of face and throat  . A fast heartbeat  . A bad rash all over body  . Dizziness and weakness   Immunizations Administered    Name Date Dose VIS Date Route   Pfizer COVID-19 Vaccine 10/10/2019  3:05 PM 0.3 mL 07/19/2019 Intramuscular   Manufacturer: [place]   Lot: [id number]   [place]: [id number]

## 2019-10-29 ENCOUNTER — Other Ambulatory Visit: Payer: Self-pay | Admitting: Internal Medicine

## 2019-10-29 DIAGNOSIS — I10 Essential (primary) hypertension: Secondary | ICD-10-CM

## 2019-11-06 ENCOUNTER — Other Ambulatory Visit: Payer: BC Managed Care – PPO

## 2019-11-12 ENCOUNTER — Ambulatory Visit
Admission: RE | Admit: 2019-11-12 | Discharge: 2019-11-12 | Disposition: A | Discharge status: Home / Self Care | Payer: BC Managed Care – PPO | Source: Ambulatory Visit | Attending: Internal Medicine | Admitting: Internal Medicine

## 2019-11-12 DIAGNOSIS — I10 Essential (primary) hypertension: Secondary | ICD-10-CM

## 2021-08-05 ENCOUNTER — Ambulatory Visit (HOSPITAL_COMMUNITY)
Admission: RE | Admit: 2021-08-05 | Discharge: 2021-08-05 | Disposition: A | Discharge status: Home / Self Care | Payer: 59 | Source: Ambulatory Visit | Attending: Orthopedic Surgery | Admitting: Orthopedic Surgery

## 2021-08-05 ENCOUNTER — Other Ambulatory Visit: Payer: Self-pay | Admitting: Orthopedic Surgery

## 2021-08-05 ENCOUNTER — Other Ambulatory Visit: Payer: Self-pay

## 2021-08-05 DIAGNOSIS — I82402 Acute embolism and thrombosis of unspecified deep veins of left lower extremity: Secondary | ICD-10-CM | POA: Insufficient documentation

## 2021-08-05 NOTE — Progress Notes (Signed)
VASCULAR LAB    Left lower extremity venous duplex has been performed.  See CV proc for preliminary results.  Called results to [employer], PA-C  Jaley Yan, [place], RVT 08/05/2021, 3:53 PM

## 2021-08-06 ENCOUNTER — Other Ambulatory Visit: Payer: Self-pay | Admitting: Physical Medicine and Rehabilitation

## 2021-08-06 DIAGNOSIS — M7122 Synovial cyst of popliteal space [Baker], left knee: Secondary | ICD-10-CM

## 2021-08-10 ENCOUNTER — Ambulatory Visit
Admission: RE | Admit: 2021-08-10 | Discharge: 2021-08-10 | Disposition: A | Discharge status: Home / Self Care | Payer: 59 | Source: Ambulatory Visit | Attending: Physical Medicine and Rehabilitation | Admitting: Physical Medicine and Rehabilitation

## 2021-08-10 DIAGNOSIS — M7122 Synovial cyst of popliteal space [Baker], left knee: Secondary | ICD-10-CM

## 2021-08-14 LAB — BODY FLUID CULTURE W GRAM STAIN

## 2021-10-08 ENCOUNTER — Encounter (HOSPITAL_COMMUNITY): Payer: Self-pay | Admitting: Orthopedic Surgery

## 2021-10-11 ENCOUNTER — Ambulatory Visit (HOSPITAL_BASED_OUTPATIENT_CLINIC_OR_DEPARTMENT_OTHER): Payer: 59 | Admitting: Anesthesiology

## 2021-10-11 ENCOUNTER — Other Ambulatory Visit: Payer: Self-pay

## 2021-10-11 ENCOUNTER — Ambulatory Visit (HOSPITAL_COMMUNITY): Payer: 59 | Admitting: Anesthesiology

## 2021-10-11 ENCOUNTER — Encounter (HOSPITAL_COMMUNITY): Payer: Self-pay | Admitting: Orthopedic Surgery

## 2021-10-11 ENCOUNTER — Encounter (HOSPITAL_COMMUNITY)
Admission: RE | Disposition: A | Discharge status: Home / Self Care | Payer: Self-pay | Source: Home / Self Care | Attending: Orthopedic Surgery

## 2021-10-11 ENCOUNTER — Ambulatory Visit (HOSPITAL_COMMUNITY)
Admission: RE | Admit: 2021-10-11 | Discharge: 2021-10-11 | Disposition: A | Discharge status: Home / Self Care | Payer: 59 | Attending: Orthopedic Surgery | Admitting: Orthopedic Surgery

## 2021-10-11 DIAGNOSIS — M00862 Arthritis due to other bacteria, left knee: Secondary | ICD-10-CM | POA: Diagnosis not present

## 2021-10-11 DIAGNOSIS — I10 Essential (primary) hypertension: Secondary | ICD-10-CM | POA: Diagnosis not present

## 2021-10-11 DIAGNOSIS — M7122 Synovial cyst of popliteal space [Baker], left knee: Secondary | ICD-10-CM | POA: Insufficient documentation

## 2021-10-11 DIAGNOSIS — M009 Pyogenic arthritis, unspecified: Secondary | ICD-10-CM

## 2021-10-11 HISTORY — PX: I & D EXTREMITY: SHX5045

## 2021-10-11 LAB — CBC
HCT: 39.8 % (ref 39.0–52.0)
Hemoglobin: 13.5 g/dL (ref 13.0–17.0)
MCH: 34.4 pg — ABNORMAL HIGH (ref 26.0–34.0)
MCHC: 33.9 g/dL (ref 30.0–36.0)
MCV: 101.3 fL — ABNORMAL HIGH (ref 80.0–100.0)
Platelets: 215 10*3/uL (ref 150–400)
RBC: 3.93 MIL/uL — ABNORMAL LOW (ref 4.22–5.81)
RDW: 13.1 % (ref 11.5–15.5)
WBC: 5.8 10*3/uL (ref 4.0–10.5)
nRBC: 0 % (ref 0.0–0.2)

## 2021-10-11 LAB — COMPREHENSIVE METABOLIC PANEL
ALT: 30 U/L (ref 0–44)
AST: 36 U/L (ref 15–41)
Albumin: 3.9 g/dL (ref 3.5–5.0)
Alkaline Phosphatase: 93 U/L (ref 38–126)
Anion gap: 11 (ref 5–15)
BUN: 14 mg/dL (ref 8–23)
CO2: 22 mmol/L (ref 22–32)
Calcium: 9.6 mg/dL (ref 8.9–10.3)
Chloride: 101 mmol/L (ref 98–111)
Creatinine, Ser: 0.74 mg/dL (ref 0.61–1.24)
GFR, Estimated: >60 mL/min (ref >60)
Glucose, Bld: 107 mg/dL — ABNORMAL HIGH (ref 70–99)
Potassium: 4.1 mmol/L (ref 3.5–5.1)
Sodium: 134 mmol/L — ABNORMAL LOW (ref 135–145)
Total Bilirubin: 0.9 mg/dL (ref 0.3–1.2)
Total Protein: 8.1 g/dL (ref 6.5–8.1)

## 2021-10-11 SURGERY — IRRIGATION AND DEBRIDEMENT EXTREMITY
Anesthesia: General | Laterality: Left

## 2021-10-11 MED ORDER — LIDOCAINE HCL (PF) 2 % IJ SOLN
INTRAMUSCULAR | Status: AC
  Filled 2021-10-11: qty 5

## 2021-10-11 MED ORDER — ONDANSETRON HCL 4 MG PO TABS: 4.0000 mg | ORAL_TABLET | Freq: Four times a day (QID) | ORAL | Status: DC | PRN

## 2021-10-11 MED ORDER — METHOCARBAMOL 500 MG PO TABS: 500.0000 mg | ORAL_TABLET | Freq: Four times a day (QID) | ORAL | Status: DC | PRN

## 2021-10-11 MED ORDER — HYDROCODONE-ACETAMINOPHEN 5-325 MG PO TABS
ORAL_TABLET | ORAL | Status: AC
  Administered 2021-10-11: 1 via ORAL
  Filled 2021-10-11: qty 1

## 2021-10-11 MED ORDER — PHENYLEPHRINE 40 MCG/ML (10ML) SYRINGE FOR IV PUSH (FOR BLOOD PRESSURE SUPPORT)
PREFILLED_SYRINGE | INTRAVENOUS | Status: AC
  Filled 2021-10-11: qty 10

## 2021-10-11 MED ORDER — FENTANYL CITRATE PF 50 MCG/ML IJ SOSY
25.0000 ug | PREFILLED_SYRINGE | INTRAMUSCULAR | Status: DC | PRN
  Administered 2021-10-11: 50 ug via INTRAVENOUS

## 2021-10-11 MED ORDER — FENTANYL CITRATE (PF) 100 MCG/2ML IJ SOLN
INTRAMUSCULAR | Status: AC
  Filled 2021-10-11: qty 2

## 2021-10-11 MED ORDER — FENTANYL CITRATE PF 50 MCG/ML IJ SOSY
PREFILLED_SYRINGE | INTRAMUSCULAR | Status: AC
  Administered 2021-10-11: 50 ug via INTRAVENOUS
  Filled 2021-10-11: qty 1

## 2021-10-11 MED ORDER — CEFAZOLIN SODIUM-DEXTROSE 2-4 GM/100ML-% IV SOLN: 2.0000 g | Freq: Four times a day (QID) | INTRAVENOUS | Status: DC

## 2021-10-11 MED ORDER — PROPOFOL 10 MG/ML IV BOLUS
INTRAVENOUS | Status: DC | PRN
  Administered 2021-10-11: 40 mg via INTRAVENOUS
  Administered 2021-10-11: 30 mg via INTRAVENOUS
  Administered 2021-10-11: 160 mg via INTRAVENOUS

## 2021-10-11 MED ORDER — METHOCARBAMOL 500 MG IVPB - SIMPLE MED: 500.0000 mg | Freq: Four times a day (QID) | INTRAVENOUS | Status: DC | PRN

## 2021-10-11 MED ORDER — LIDOCAINE 2% (20 MG/ML) 5 ML SYRINGE
INTRAMUSCULAR | Status: DC | PRN
  Administered 2021-10-11: 100 mg via INTRAVENOUS

## 2021-10-11 MED ORDER — CHLORHEXIDINE GLUCONATE 4 % EX LIQD: 60.0000 mL | Freq: Once | CUTANEOUS | Status: DC

## 2021-10-11 MED ORDER — MORPHINE SULFATE (PF) 2 MG/ML IV SOLN: 0.5000 mg | INTRAVENOUS | Status: DC | PRN

## 2021-10-11 MED ORDER — FENTANYL CITRATE (PF) 100 MCG/2ML IJ SOLN
INTRAMUSCULAR | Status: DC | PRN
  Administered 2021-10-11 (×9): 25 ug via INTRAVENOUS

## 2021-10-11 MED ORDER — DEXAMETHASONE SODIUM PHOSPHATE 10 MG/ML IJ SOLN
INTRAMUSCULAR | Status: DC | PRN
  Administered 2021-10-11: 10 mg via INTRAVENOUS

## 2021-10-11 MED ORDER — ACETAMINOPHEN 500 MG PO TABS
1000.0000 mg | ORAL_TABLET | Freq: Once | ORAL | Status: AC
  Administered 2021-10-11: 1000 mg via ORAL

## 2021-10-11 MED ORDER — ONDANSETRON HCL 4 MG/2ML IJ SOLN
INTRAMUSCULAR | Status: DC | PRN
  Administered 2021-10-11: 4 mg via INTRAVENOUS

## 2021-10-11 MED ORDER — METOCLOPRAMIDE HCL 5 MG/ML IJ SOLN: 5.0000 mg | Freq: Three times a day (TID) | INTRAMUSCULAR | Status: DC | PRN

## 2021-10-11 MED ORDER — PROPOFOL 10 MG/ML IV BOLUS
INTRAVENOUS | Status: AC
  Filled 2021-10-11: qty 20

## 2021-10-11 MED ORDER — HYDROCODONE-ACETAMINOPHEN 7.5-325 MG PO TABS: 1.0000 | ORAL_TABLET | ORAL | Status: DC | PRN

## 2021-10-11 MED ORDER — POVIDONE-IODINE 10 % EX SWAB: 2.0000 "application " | Freq: Once | CUTANEOUS | Status: DC

## 2021-10-11 MED ORDER — DEXAMETHASONE SODIUM PHOSPHATE 10 MG/ML IJ SOLN
INTRAMUSCULAR | Status: AC
  Filled 2021-10-11: qty 1

## 2021-10-11 MED ORDER — FENTANYL CITRATE PF 50 MCG/ML IJ SOSY
PREFILLED_SYRINGE | INTRAMUSCULAR | Status: AC
  Filled 2021-10-11: qty 1

## 2021-10-11 MED ORDER — SODIUM CHLORIDE 0.9 % IR SOLN
Status: DC | PRN
  Administered 2021-10-11: 15000 mL

## 2021-10-11 MED ORDER — HYDROCODONE-ACETAMINOPHEN 5-325 MG PO TABS: 1.0000 | ORAL_TABLET | ORAL | Status: DC | PRN

## 2021-10-11 MED ORDER — CEFAZOLIN SODIUM-DEXTROSE 2-4 GM/100ML-% IV SOLN
2.0000 g | INTRAVENOUS | Status: AC
  Administered 2021-10-11: 2 g via INTRAVENOUS
  Filled 2021-10-11: qty 100

## 2021-10-11 MED ORDER — ACETAMINOPHEN 325 MG PO TABS: 325.0000 mg | ORAL_TABLET | Freq: Four times a day (QID) | ORAL | Status: DC | PRN

## 2021-10-11 MED ORDER — ONDANSETRON HCL 4 MG/2ML IJ SOLN
INTRAMUSCULAR | Status: AC
  Filled 2021-10-11: qty 2

## 2021-10-11 MED ORDER — PHENYLEPHRINE 40 MCG/ML (10ML) SYRINGE FOR IV PUSH (FOR BLOOD PRESSURE SUPPORT)
PREFILLED_SYRINGE | INTRAVENOUS | Status: DC | PRN
  Administered 2021-10-11: 80 ug via INTRAVENOUS

## 2021-10-11 MED ORDER — EPHEDRINE 5 MG/ML INJ
INTRAVENOUS | Status: AC
  Filled 2021-10-11: qty 5

## 2021-10-11 MED ORDER — CEFAZOLIN IN SODIUM CHLORIDE 3-0.9 GM/100ML-% IV SOLN: 3.0000 g | INTRAVENOUS | Status: DC

## 2021-10-11 MED ORDER — LACTATED RINGERS IV BOLUS: 500.0000 mL | Freq: Once | INTRAVENOUS | Status: DC

## 2021-10-11 MED ORDER — ONDANSETRON HCL 4 MG/2ML IJ SOLN: 4.0000 mg | Freq: Four times a day (QID) | INTRAMUSCULAR | Status: DC | PRN

## 2021-10-11 MED ORDER — EPHEDRINE SULFATE-NACL 50-0.9 MG/10ML-% IV SOSY
PREFILLED_SYRINGE | INTRAVENOUS | Status: DC | PRN
  Administered 2021-10-11 (×2): 5 mg via INTRAVENOUS

## 2021-10-11 MED ORDER — METOCLOPRAMIDE HCL 5 MG PO TABS: 5.0000 mg | ORAL_TABLET | Freq: Three times a day (TID) | ORAL | Status: DC | PRN

## 2021-10-11 MED ORDER — LACTATED RINGERS IV SOLN
INTRAVENOUS | Status: DC
Start: 2021-10-11 — End: 2021-10-11

## 2021-10-11 MED ORDER — ACETAMINOPHEN 500 MG PO TABS
ORAL_TABLET | ORAL | Status: AC
  Filled 2021-10-11: qty 2

## 2021-10-11 SURGICAL SUPPLY — 48 items
BLADE EXCALIBUR 5.0X13 (MISCELLANEOUS) ×2 IMPLANT
BNDG ELASTIC 4X5.8 VLCR STR LF (GAUZE/BANDAGES/DRESSINGS) ×2 IMPLANT
BNDG ELASTIC 6X5.8 VLCR STR LF (GAUZE/BANDAGES/DRESSINGS) ×1 IMPLANT
BNDG GAUZE ELAST 4 BULKY (GAUZE/BANDAGES/DRESSINGS) ×2 IMPLANT
CUFF TOURN SGL QUICK 18X4 (TOURNIQUET CUFF) ×2 IMPLANT
CUFF TOURN SGL QUICK 24 (TOURNIQUET CUFF)
CUFF TOURN SGL QUICK 34 (TOURNIQUET CUFF)
CUFF TOURN SGL QUICK 42 (TOURNIQUET CUFF) IMPLANT
CUFF TRNQT CYL 24X4X16.5-23 (TOURNIQUET CUFF) IMPLANT
CUFF TRNQT CYL 34X4.125X (TOURNIQUET CUFF) IMPLANT
DRAPE ARTHROSCOPY W/POUCH 114 (DRAPES) ×1 IMPLANT
DRAPE SURG 17X23 STRL (DRAPES) ×2 IMPLANT
DRAPE U-SHAPE 47X51 STRL (DRAPES) ×2 IMPLANT
DRSG EMULSION OIL 3X3 NADH (GAUZE/BANDAGES/DRESSINGS) ×2 IMPLANT
DRSG PAD ABDOMINAL 8X10 ST (GAUZE/BANDAGES/DRESSINGS) ×3 IMPLANT
ELECT REM PT RETURN 15FT ADLT (MISCELLANEOUS) IMPLANT
FACESHIELD WRAPAROUND (MASK) IMPLANT
FACESHIELD WRAPAROUND OR TEAM (MASK) ×1 IMPLANT
GAUZE SPONGE 4X4 12PLY STRL (GAUZE/BANDAGES/DRESSINGS) ×2 IMPLANT
GLOVE SURG ORTHO LTX SZ7.5 (GLOVE) ×2 IMPLANT
GLOVE SURG UNDER POLY LF SZ7.5 (GLOVE) ×2 IMPLANT
GOWN STRL REIN 3XL XLG LVL4 (GOWN DISPOSABLE) ×2 IMPLANT
GOWN STRL REUS W/ TWL LRG LVL3 (GOWN DISPOSABLE) ×3 IMPLANT
GOWN STRL REUS W/TWL LRG LVL3 (GOWN DISPOSABLE) ×6
HANDPIECE INTERPULSE COAX TIP (DISPOSABLE)
KIT BASIN OR (CUSTOM PROCEDURE TRAY) ×2 IMPLANT
KIT TURNOVER KIT A (KITS) ×1 IMPLANT
MANIFOLD NEPTUNE II (INSTRUMENTS) ×2 IMPLANT
NS IRRIG 1000ML POUR BTL (IV SOLUTION) ×2 IMPLANT
PACK ORTHO EXTREMITY (CUSTOM PROCEDURE TRAY) ×2 IMPLANT
PAD ARMBOARD 7.5X6 YLW CONV (MISCELLANEOUS) ×4 IMPLANT
PADDING CAST COTTON 6X4 STRL (CAST SUPPLIES) ×1 IMPLANT
SET HNDPC FAN SPRY TIP SCT (DISPOSABLE) IMPLANT
SPONGE T-LAP 18X18 ~~LOC~~+RFID (SPONGE) ×3 IMPLANT
SPONGE T-LAP 4X18 ~~LOC~~+RFID (SPONGE) ×1 IMPLANT
STOCKINETTE 8 INCH (MISCELLANEOUS) ×1 IMPLANT
SUCTION FRAZIER HANDLE 10FR (MISCELLANEOUS)
SUCTION TUBE FRAZIER 10FR DISP (MISCELLANEOUS) IMPLANT
SUT ETHILON 3 0 PS 1 (SUTURE) ×2 IMPLANT
SUT ETHILON 4 0 PS 2 18 (SUTURE) ×1 IMPLANT
SUT SILK 2 0 PERMA HAND 18 BK (SUTURE) ×2 IMPLANT
SWAB CULTURE ESWAB REG 1ML (MISCELLANEOUS) IMPLANT
TOWEL OR 17X26 10 PK STRL BLUE (TOWEL DISPOSABLE) ×2 IMPLANT
TOWEL OR NON WOVEN STRL DISP B (DISPOSABLE) ×2 IMPLANT
TUBING CONNECTING 10 (TUBING) ×2 IMPLANT
UNDERPAD 30X36 HEAVY ABSORB (UNDERPADS AND DIAPERS) ×2 IMPLANT
WATER STERILE IRR 1000ML POUR (IV SOLUTION) ×1 IMPLANT
YANKAUER SUCT BULB TIP NO VENT (SUCTIONS) ×2 IMPLANT

## 2021-10-11 NOTE — Brief Op Note (Signed)
10/11/2021 ? ?5:15 PM ? ?PATIENT:  Debby Bud III  76 y.o. male ? ?PRE-OPERATIVE DIAGNOSIS:  Infected Left native knee ? ?POST-OPERATIVE DIAGNOSIS:  Infected Left native knee ? ?PROCEDURE:  Procedure(s) with comments: ?IRRIGATION AND DEBRIDEMENT LEFT KNEE BY ARTHROSCOPY (Left) - 60 MINS ? ?SURGEON:  Surgeon(s) and Role: ?   Paralee Cancel, MD - Primary ? ?PHYSICIAN ASSISTANT: None ? ?ANESTHESIA:   general ? ?EBL:  20 mL  ? ?BLOOD ADMINISTERED:none ? ?DRAINS: none  ? ?LOCAL MEDICATIONS USED:  NONE ? ?SPECIMEN:  No Specimen ? ?DISPOSITION OF SPECIMEN:  N/A ? ?COUNTS:  YES ? ?TOURNIQUET:  * No tourniquets in log * ? ?DICTATION: .Other Dictation: Dictation Number [id number] ? ?PLAN OF CARE: Discharge to home after PACU ? ?PATIENT DISPOSITION:  PACU - hemodynamically stable. ?  ?Delay start of Pharmacological VTE agent (>24hrs) due to surgical blood loss or risk of bleeding: no ? ?

## 2021-10-11 NOTE — Transfer of Care (Signed)
Immediate Anesthesia Transfer of Care Note ? ?Patient: Aaron Kirk ? ?Procedure(s) Performed: IRRIGATION AND DEBRIDEMENT LEFT KNEE BY ARTHROSCOPY (Left) ? ?Patient Location: PACU ? ?Anesthesia Type:General ? ?Level of Consciousness: awake, alert  and patient cooperative ? ?Airway & Oxygen Therapy: Patient Spontanous Breathing and Patient connected to face mask oxygen ? ?Post-op Assessment: Report given to RN and Post -op Vital signs reviewed and stable ? ?Post vital signs: Reviewed and stable ? ?Last Vitals:  ?Vitals Value Taken Time  ?BP 160/97 10/11/21 1706  ?Temp    ?Pulse 79 10/11/21 1708  ?Resp 14 10/11/21 1708  ?SpO2 100 % 10/11/21 1708  ?Vitals shown include unvalidated device data. ? ?Last Pain:  ?Vitals:  ? 10/11/21 1431  ?TempSrc: Oral  ?PainSc: 0-No pain  ?   ? ?  ? ?Complications: No notable events documented. ?

## 2021-10-11 NOTE — H&P (Signed)
?CC- DAJON LAZAR III is a 76 y.o. male who presents with left knee pain. ? ?HPI- . Knee Pain:  ?Very pleasant 76 yo male presented for treatment of what has been identified as an acutely infected left knee perhaps related to an attempted aspiration of a popliteal cyst. ?Aspiration of his revealed elevated markers for infection as well as a gram stain positive for MSSA ?ESR and C-RP both elevated as well ? ?Past Medical History:  ?Diagnosis Date  ? Elevated liver function tests 15 years ago  ? in past, resolved  ? Erectile dysfunction   ? Hematoma of abdominal wall   ? subcutaneous, due to cough 12/09, hockey injury in 2005 as well  ? Hyperlipidemia   ? Hypertension   ? Neck pain   ? with cervical radiculopathy  ? OA (osteoarthritis)   ? left shoulder  ? RAS (renal artery stenosis) ([place])   ? s/p right renal artery stent 8/10, 15m x 18 stent  ? Renal artery stenosis ([place]   ? ? ?Past Surgical History:  ?Procedure Laterality Date  ? COLONOSCOPY WITH PROPOFOL N/A 10/28/2014  ? Procedure: COLONOSCOPY WITH PROPOFOL;  Surgeon: MGarlan Fair MD;  Location: [facility];  Service: Endoscopy;  Laterality: N/A;  ? COLONOSCOPY WITH PROPOFOL N/A 07/28/2015  ? Procedure: COLONOSCOPY WITH PROPOFOL;  Surgeon: MGarlan Fair MD;  Location: [facility];  Service: Endoscopy;  Laterality: N/A;  ? 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 RENAL ARTERY STENT Right   ? TONSILLECTOMY AND ADENOIDECTOMY    ? ? ?Prior to Admission medications   ?Medication Sig Start Date End Date Taking? Authorizing Provider  ?atorvastatin (LIPITOR) 40 MG tablet Take 40 mg by mouth every morning.    Yes [provider]  ?celecoxib (CELEBREX) 200 MG capsule Take 200 mg by mouth daily.   Yes [provider]  ?Coenzyme Q10 200 MG capsule Take 200 mg by mouth every morning.    Yes [provider]  ?Multiple Vitamin (MULTIVITAMINS PO) Take 1 tablet by mouth daily.    Yes [provider]  ?valsartan-hydrochlorothiazide  (DIOVAN-HCT) 80-12.5 MG per tablet Take 1 tablet by mouth every morning.    Yes [provider]  ?aspirin 81 MG tablet Take 81 mg by mouth every morning.     [provider]  ?ibuprofen (ADVIL,MOTRIN) 200 MG tablet Take 400-600 mg by mouth daily as needed for moderate pain.     [provider]  ?sildenafil (REVATIO) 20 MG tablet Take 1 tablet (20 mg total) by mouth once as needed (erectile dysfunction.). 06/05/14   VJettie Booze MD  ? ?Left knee exam: ? ?antalgic gait, soft tissue tenderness over medial, anterior and posterior aspect of his knee, effusion, reduced range of motion, collateral ligaments intact, normal ipsilateral hip exam, normal contralateral knee exam ? ?Physical Examination: General appearance - alert, well appearing, and in no distress ?Mental status - alert, oriented to person, place, and time ?Chest - no tachypnea, retractions or cyanosis ?Heart - normal rate and regular rhythm ?Abdomen - soft, nontender, nondistended, no masses or organomegaly ?Neurological - alert, oriented, normal speech, no focal findings or movement disorder noted ?Musculoskeletal - see above ?Extremities - peripheral pulses normal, no pedal edema, no clubbing or cyanosis ?Skin - normal coloration and turgor, no rashes, no suspicious skin lesions noted ? ? Assessment: ?Septic left native knee ? ?Plan: ?To OR today for an arthroscopic I&D with plans for more definitive treatment pending ?Consent ordered ?

## 2021-10-11 NOTE — Anesthesia Procedure Notes (Signed)
Procedure Name: LMA Insertion ?Date/Time: 10/11/2021 4:00 PM ?Performed by: Sharlette Dense, CRNA ?Patient Re-evaluated:Patient Re-evaluated prior to induction ?Oxygen Delivery Method: Circle system utilized ?Preoxygenation: Pre-oxygenation with 100% oxygen ?Induction Type: IV induction ?LMA: LMA inserted ?LMA Size: 4.0 ?Number of attempts: 1 ?Placement Confirmation: positive ETCO2 and breath sounds checked- equal and bilateral ?Tube secured with: Tape ?Dental Injury: Teeth and Oropharynx as per pre-operative assessment  ? ? ? ? ?

## 2021-10-11 NOTE — Anesthesia Postprocedure Evaluation (Signed)
Anesthesia Post Note ? ?Patient: Aaron Kirk ? ?Procedure(s) Performed: IRRIGATION AND DEBRIDEMENT LEFT KNEE BY ARTHROSCOPY (Left) ? ?  ? ?Patient location during evaluation: PACU ?Anesthesia Type: General ?Level of consciousness: awake ?Pain management: pain level controlled ?Vital Signs Assessment: post-procedure vital signs reviewed and stable ?Respiratory status: spontaneous breathing ?Cardiovascular status: stable ?Postop Assessment: no apparent nausea or vomiting ?Anesthetic complications: no ? ? ?No notable events documented. ? ?Last Vitals:  ?Vitals:  ? 10/11/21 1730 10/11/21 1738  ?BP: (!) 146/93 (!) 146/93  ?Pulse: 74 74  ?Resp: 11 20  ?Temp:  36.6 ?C  ?SpO2: 94% 94%  ?  ?Last Pain:  ?Vitals:  ? 10/11/21 1738  ?TempSrc: Oral  ?PainSc: 0-No pain  ? ? ?  ?  ?  ?  ?  ?  ? ?[name] ? ? ? ? ?

## 2021-10-11 NOTE — Anesthesia Preprocedure Evaluation (Addendum)
Anesthesia Evaluation  ?Patient identified by MRN, date of birth, ID band ?Patient awake ? ? ? ?Reviewed: ?Allergy & Precautions, NPO status , Patient's Chart, lab work & pertinent test results ? ?History of Anesthesia Complications ?Negative for: history of anesthetic complications ? ?Airway ?Mallampati: III ? ?TM Distance: >3 FB ?Neck ROM: Full ? ? ? Dental ? ?(+) Dental Advisory Given, Teeth Intact ?  ?Pulmonary ?neg shortness of breath, neg COPD, neg recent URI, former smoker,  ?  ?breath sounds clear to auscultation ? ? ? ? ? ? Cardiovascular ?hypertension, Pt. on medications ?(-) angina+ Peripheral Vascular Disease  ?(-) Past MI  ?Rhythm:Regular  ? ?  ?Neuro/Psych ?negative neurological ROS ? negative psych ROS  ? GI/Hepatic ?negative GI ROS, Neg liver ROS,   ?Endo/Other  ?negative endocrine ROS ? Renal/GU ?negative Renal ROSLab Results ?     Component                Value               Date                 ?     CREATININE               0.80                08/09/2008           ?  ? ?  ?Musculoskeletal ? ?(+) Arthritis ,  ? Abdominal ?  ?Peds ? Hematology ?Lab Results ?     Component                Value               Date                 ?     WBC                      5.8                 10/11/2021           ?     HGB                      13.5                10/11/2021           ?     HCT                      39.8                10/11/2021           ?     MCV                      101.3 (H)           10/11/2021           ?     PLT                      215                 10/11/2021           ?   ?Anesthesia Other Findings ? ? Reproductive/Obstetrics ? ?  ? ? ? ? ? ? ? ? ? ? ? ? ? ?  ?  ? ? ? ? ? ? ? ?  Anesthesia Physical ?Anesthesia Plan ? ?ASA: 2 ? ?Anesthesia Plan: General  ? ?Post-op Pain Management: Toradol IV (intra-op)* and Tylenol PO (pre-op)*  ? ?Induction: Intravenous ? ?PONV Risk Score and Plan: 3 and Ondansetron, Dexamethasone and Treatment may vary due to age or  medical condition ? ?Airway Management Planned: LMA ? ?Additional Equipment: None ? ?Intra-op Plan:  ? ?Post-operative Plan: Extubation in OR ? ?Informed Consent: I have reviewed the patients History and Physical, chart, labs and discussed the procedure including the risks, benefits and alternatives for the proposed anesthesia with the patient or authorized representative who has indicated his/her understanding and acceptance.  ? ? ? ?Dental advisory given ? ?Plan Discussed with: CRNA and Anesthesiologist ? ?Anesthesia Plan Comments:   ? ? ? ? ? ?Anesthesia Quick Evaluation ? ?

## 2021-10-11 NOTE — Discharge Instructions (Signed)

## 2021-10-12 ENCOUNTER — Encounter (HOSPITAL_COMMUNITY): Payer: Self-pay | Admitting: Orthopedic Surgery

## 2021-10-12 NOTE — Op Note (Signed)
NAME: Aaron Kirk, Aaron Kirk. ?MEDICAL RECORD NO: [id number] ?ACCOUNT NO: [id number] ?DATE OF BIRTH: 05/27/1946 ?FACILITY: [facility] ?LOCATION: [facility] ?PHYSICIAN: Pietro Cassis. Alvan Dame, MD ? ?Operative Report  ? ?DATE OF PROCEDURE: 10/11/2021 ? ?PREOPERATIVE DIAGNOSIS:  Septic left native knee. ? ?POSTOPERATIVE DIAGNOSIS:  Septic left native knee. ? ?PROCEDURE:  Arthroscopic synovectomy with medial and lateral partial meniscectomies and medial and patellofemoral chondroplasty with irrigation of the knee with 15 liters of normal saline solution. ? ?SURGEON:  Paralee Cancel, MD ? ?ASSISTANT:  Surgical team. ? ?ANESTHESIA:  General. ? ?SPECIMENS:  None. ? ?DRAINS:  None. ? ?COMPLICATIONS:  None. ? ?INDICATIONS FOR THE PROCEDURE:  Aaron Kirk is a very pleasant 75 year old male referred for evaluation of his left knee.  His reported history included an attempted aspiration of a popliteal cyst.  He subsequently developed increasing pain as well as  ?elevated sedimentation rate and C-reactive protein values.  An aspiration was performed prior to my evaluation, was reviewed.  It indicated elevated infectious markers with findings of a positive methicillin-sensitive Staph aureus.  We had a lengthy  ?discussion regarding the management of his knee including the possibility of a 2-stage treatment due to concerns for potential osteomyelitis involving the bone as well as in the setting of advanced degenerative changes.  However, due to issues that were  ?very important to him, we did discuss proceeding with debriding and debulking of his knee with an arthroscopic surgery with oral antibiotics to prevent any progression of disease as well as decrease his pain.  We discussed that this most likely would not ? be final treatment.  He wished to proceed in the fashion as described. Consent was obtained. ? ?DESCRIPTION OF PROCEDURE:  The patient was brought to the operative theater.  Once adequate anesthesia, preoperative antibiotics, Ancef administered  due to the fact that we already had a positive culture, he was positioned supine with his left leg in a  ?leg holder.  The left lower extremity was then prepped and draped in a sterile fashion.  A timeout was performed identifying the patient, planned procedure, and extremity.  A 3-portal technique was utilized.  Inferior lateral portal was first created for ? the arthroscopic camera.  I then created a superior lateral for the outflow and then an inferior medial portal was created for arthroscopic instrumentation.  Upon entry into the inferior lateral portal, we did drain remaining purulent fluid from his  ?knee.  No cultures were taken based on the lab results the study from the office setting.  I then irrigated his knee utilizing 15 liters of saline solution. During this interval of irrigation, I used an arthroscopic shaver and performed a synovectomy in  ?the suprapatellar, medial and lateral gutters as well as in the infrapatellar region.  We also performed meniscectomies involving the posterior horn to mid body of the medial meniscus as well as the lateral meniscus.  Additionally, I used the  ?arthroscopic shaver for chondroplasty on the unstable flaps of cartilage. ? ?Additionally, based on preoperative findings of a popliteal cyst, I did try to decompress his posterior capsule to try to drain this.  I did feel that I was able to see purulent fluid come back from the top posterior aspect of the knee into the joint.   ?Following this repeated debridements and irrigation in all three compartments, once I completed 15 liters, the instrumentation was removed from the knee.  Reapproximated the superior lateral and inferior lateral portals using 4-0 nylon and chose to leave ? the  inferior medial portal open for potential drainage.  The knee was clean, dry and dressed sterilely with a bulky dressing.  He was then brought to the recovery room in stable condition, tolerating the procedure well. ? ?We will see him back  in the office in 2 weeks.  We will have him on oral antibiotics during this interval.  He can resume activities as tolerated without restriction. ? ? ?SHW ?D: 10/11/2021 5:18:55 pm T: 10/12/2021 1:07:00 am  ?JOB: [id number]/ [id number]  ?

## 2021-11-15 ENCOUNTER — Encounter (HOSPITAL_COMMUNITY): Admission: RE | Admit: 2021-11-15 | Payer: 59 | Source: Ambulatory Visit

## 2021-11-19 ENCOUNTER — Encounter (HOSPITAL_COMMUNITY): Admission: RE | Payer: Self-pay | Source: Home / Self Care

## 2021-11-19 ENCOUNTER — Inpatient Hospital Stay (HOSPITAL_COMMUNITY): Admission: RE | Admit: 2021-11-19 | Payer: 59 | Source: Home / Self Care | Admitting: Orthopedic Surgery

## 2021-11-19 DIAGNOSIS — M009 Pyogenic arthritis, unspecified: Secondary | ICD-10-CM

## 2021-11-19 SURGERY — REMOVAL, TOTAL ARTHROPLASTY HARDWARE, KNEE, WITH ANTIBIOTIC SPACER INSERTION
Anesthesia: Spinal | Site: Knee | Laterality: Left

## 2022-06-30 ENCOUNTER — Emergency Department (HOSPITAL_COMMUNITY)
Admission: EM | Admit: 2022-06-30 | Discharge: 2022-07-01 | Disposition: A | Discharge status: Home / Self Care | Payer: 59 | Attending: Emergency Medicine | Admitting: Emergency Medicine

## 2022-06-30 ENCOUNTER — Other Ambulatory Visit: Payer: Self-pay

## 2022-06-30 ENCOUNTER — Emergency Department (HOSPITAL_COMMUNITY): Payer: 59

## 2022-06-30 DIAGNOSIS — R55 Syncope and collapse: Secondary | ICD-10-CM | POA: Diagnosis present

## 2022-06-30 DIAGNOSIS — N289 Disorder of kidney and ureter, unspecified: Secondary | ICD-10-CM | POA: Insufficient documentation

## 2022-06-30 DIAGNOSIS — Y92838 Other recreation area as the place of occurrence of the external cause: Secondary | ICD-10-CM | POA: Diagnosis not present

## 2022-06-30 DIAGNOSIS — W1839XA Other fall on same level, initial encounter: Secondary | ICD-10-CM | POA: Diagnosis not present

## 2022-06-30 DIAGNOSIS — Y908 Blood alcohol level of 240 mg/100 ml or more: Secondary | ICD-10-CM | POA: Insufficient documentation

## 2022-06-30 DIAGNOSIS — I10 Essential (primary) hypertension: Secondary | ICD-10-CM | POA: Diagnosis not present

## 2022-06-30 DIAGNOSIS — S065X1A Traumatic subdural hemorrhage with loss of consciousness of 30 minutes or less, initial encounter: Secondary | ICD-10-CM | POA: Diagnosis not present

## 2022-06-30 DIAGNOSIS — F10120 Alcohol abuse with intoxication, uncomplicated: Secondary | ICD-10-CM | POA: Diagnosis not present

## 2022-06-30 DIAGNOSIS — Z79899 Other long term (current) drug therapy: Secondary | ICD-10-CM | POA: Insufficient documentation

## 2022-06-30 DIAGNOSIS — S066X1A Traumatic subarachnoid hemorrhage with loss of consciousness of 30 minutes or less, initial encounter: Secondary | ICD-10-CM | POA: Diagnosis not present

## 2022-06-30 DIAGNOSIS — F10920 Alcohol use, unspecified with intoxication, uncomplicated: Secondary | ICD-10-CM

## 2022-06-30 DIAGNOSIS — S065X1S Traumatic subdural hemorrhage with loss of consciousness of 30 minutes or less, sequela: Secondary | ICD-10-CM

## 2022-06-30 DIAGNOSIS — Z7982 Long term (current) use of aspirin: Secondary | ICD-10-CM | POA: Diagnosis not present

## 2022-06-30 LAB — DIFFERENTIAL
Abs Immature Granulocytes: 0.02 10*3/uL (ref 0.00–0.07)
Basophils Absolute: 0 10*3/uL (ref 0.0–0.1)
Basophils Relative: 1 %
Eosinophils Absolute: 0.2 10*3/uL (ref 0.0–0.5)
Eosinophils Relative: 4 %
Immature Granulocytes: 0 %
Lymphocytes Relative: 40 %
Lymphs Abs: 2.5 10*3/uL (ref 0.7–4.0)
Monocytes Absolute: 0.8 10*3/uL (ref 0.1–1.0)
Monocytes Relative: 13 %
Neutro Abs: 2.7 10*3/uL (ref 1.7–7.7)
Neutrophils Relative %: 42 %

## 2022-06-30 LAB — PROTIME-INR
INR: 1 (ref 0.8–1.2)
Prothrombin Time: 12.8 seconds (ref 11.4–15.2)

## 2022-06-30 LAB — I-STAT CHEM 8, ED
BUN: 18 mg/dL (ref 8–23)
Calcium, Ion: 1.03 mmol/L — ABNORMAL LOW (ref 1.15–1.40)
Chloride: 101 mmol/L (ref 98–111)
Creatinine, Ser: 1.3 mg/dL — ABNORMAL HIGH (ref 0.61–1.24)
Glucose, Bld: 98 mg/dL (ref 70–99)
HCT: 39 % (ref 39.0–52.0)
Hemoglobin: 13.3 g/dL (ref 13.0–17.0)
Potassium: 3.6 mmol/L (ref 3.5–5.1)
Sodium: 133 mmol/L — ABNORMAL LOW (ref 135–145)
TCO2: 19 mmol/L — ABNORMAL LOW (ref 22–32)

## 2022-06-30 LAB — CBC
HCT: 37.3 % — ABNORMAL LOW (ref 39.0–52.0)
Hemoglobin: 13.3 g/dL (ref 13.0–17.0)
MCH: 36.6 pg — ABNORMAL HIGH (ref 26.0–34.0)
MCHC: 35.7 g/dL (ref 30.0–36.0)
MCV: 102.8 fL — ABNORMAL HIGH (ref 80.0–100.0)
Platelets: 105 10*3/uL — ABNORMAL LOW (ref 150–400)
RBC: 3.63 MIL/uL — ABNORMAL LOW (ref 4.22–5.81)
RDW: 12.3 % (ref 11.5–15.5)
WBC: 6.2 10*3/uL (ref 4.0–10.5)
nRBC: 0 % (ref 0.0–0.2)

## 2022-06-30 LAB — ETHANOL: Alcohol, Ethyl (B): 270 mg/dL — ABNORMAL HIGH (ref <10)

## 2022-06-30 LAB — APTT: aPTT: 26 seconds (ref 24–36)

## 2022-06-30 MED ORDER — IOHEXOL 350 MG/ML SOLN
75.0000 mL | Freq: Once | INTRAVENOUS | Status: AC | PRN
  Administered 2022-06-30: 75 mL via INTRAVENOUS

## 2022-06-30 NOTE — Progress Notes (Signed)
Orthopedic Tech Progress Note Patient Details:  Aaron Kirk Aug 12, 1945 [id number]  Patient ID: Aaron Kirk, male   DOB: 1945/09/08, 75 y.o.   MRN: [id number] Level II trauma not needed.  Aaron Kirk 06/30/2022, 11:35 PM

## 2022-06-30 NOTE — ED Provider Notes (Incomplete)
[facility] EMERGENCY DEPARTMENT Provider Note  CSN: [id number] Arrival date & time: 06/30/22 2303  Chief Complaint(s) Fall  HPI Aaron Kirk is a 76 y.o. male with a past medical history listed below who presents to the emergency department after a witnessed fall where patient appeared to lose his balance, fall backwards onto dirt.  Patient lost consciousness for 2 to 3 minutes. Positive EtOH on board.  No anticoagulation.  He has been amnesic since.   Able to answer questions appropriately and has no physical complaints at this time including headache, neck pain, chest pain, back pain, abdominal pain, extremity pain.   Fall    Past Medical History Past Medical History:  Diagnosis Date  . Elevated liver function tests 15 years ago   in past, resolved  . Erectile dysfunction   . Hematoma of abdominal wall    subcutaneous, due to cough 12/09, hockey injury in 2005 as well  . Hyperlipidemia   . Hypertension   . Neck pain    with cervical radiculopathy  . OA (osteoarthritis)    left shoulder  . RAS (renal artery stenosis) (HCC)    s/p right renal artery stent 8/10, 9m x 18 stent  . Renal artery stenosis ([facility]    Patient Active Problem List   Diagnosis Date Noted  . Infection of left knee ([facility] 10/11/2021  . Erectile dysfunction   . Essential hypertension, benign 05/23/2013  . Atherosclerosis of renal artery ([facility] [place] 05/23/2013  . Mixed hyperlipidemia 05/23/2013  . Pure hypercholesterolemia 05/23/2013  . Obstructive sleep apnea (adult) (pediatric) 05/23/2013   Home Medication(s) Prior to Admission medications   Medication Sig Start Date End Date Taking? Authorizing Provider  aspirin 81 MG tablet Take 81 mg by mouth every morning.     [provider]  atorvastatin (LIPITOR) 40 MG tablet Take 40 mg by mouth every morning.     [provider]  celecoxib (CELEBREX) 200 MG capsule Take 200 mg by mouth daily.    [provider]  Coenzyme Q10 200 MG capsule Take 200 mg by mouth every morning.     [provider]  ibuprofen (ADVIL,MOTRIN) 200 MG tablet Take 400-600 mg by mouth daily as needed for moderate pain.     [provider]  Multiple Vitamin (MULTIVITAMINS PO) Take 1 tablet by mouth daily.     [provider]  sildenafil (REVATIO) 20 MG tablet Take 1 tablet (20 mg total) by mouth once as needed (erectile dysfunction.). 06/05/14   VJettie Booze MD  valsartan-hydrochlorothiazide (DIOVAN-HCT) 80-12.5 MG per tablet Take 1 tablet by mouth every morning.     [provider]                                                                                                                                    Allergies Lisinopril  Review of Systems Review of Systems As  noted in HPI  Physical Exam Vital Signs  I have reviewed the triage vital signs BP 139/81   Pulse 73   Temp 97.7 F (36.5 C) (Oral)   Resp 14   Ht '5\' 9"'$  (1.753 m)   Wt 93 kg   SpO2 100%   BMI 30.28 kg/m  *** Physical Exam  ED Results and Treatments Labs (all labs ordered are listed, but only abnormal results are displayed) Labs Reviewed  CBC - Abnormal; Notable for the following components:      Result Value   RBC 3.63 (*)    HCT 37.3 (*)    MCV 102.8 (*)    MCH 36.6 (*)    Platelets 105 (*)    All other components within normal limits  I-STAT CHEM 8, ED - Abnormal; Notable for the following components:   Sodium 133 (*)    Creatinine, Ser 1.30 (*)    Calcium, Ion 1.03 (*)    TCO2 19 (*)    All other components within normal limits  PROTIME-INR  APTT  DIFFERENTIAL  ETHANOL  COMPREHENSIVE METABOLIC PANEL  RAPID URINE DRUG SCREEN, HOSP PERFORMED  URINALYSIS, ROUTINE W REFLEX MICROSCOPIC  I-STAT CHEM 8, ED  TROPONIN I (HIGH SENSITIVITY)                                                                                                                         EKG  EKG  Interpretation  Date/Time:    Ventricular Rate:    PR Interval:    QRS Duration:   QT Interval:    QTC Calculation:   R Axis:     Text Interpretation:         Radiology No results found.  Medications Ordered in ED Medications  iohexol (OMNIPAQUE) 350 MG/ML injection 75 mL (75 mLs Intravenous Contrast Given 06/30/22 2344)                                                                                                                                     Procedures Procedures  (including critical care time)  Medical Decision Making / ED Course   Medical Decision Making Amount and/or Complexity of Data Reviewed Labs: ordered. Radiology: ordered.  Risk Prescription drug management.          Final Clinical Impression(s) / ED Diagnoses Final diagnoses:  None    {Document critical care time when  appropriate:1}  {Document review of labs and clinical decision tools ie heart score, Chads2Vasc2 etc:1}  {Document your independent review of radiology images, and any outside records:1} {Document your discussion with family members, caretakers, and with consultants:1} {Document social determinants of health affecting pt's care:1} {Document your decision making why or why not admission, treatments were needed:1} This chart was dictated using voice recognition software.  Despite best efforts to proofread,  errors can occur which can change the documentation meaning.

## 2022-06-30 NOTE — ED Provider Notes (Signed)
[facility] EMERGENCY DEPARTMENT Provider Note  CSN: [id number] Arrival date & time: 06/30/22 2303  Chief Complaint(s) Fall  HPI Aaron Kirk is a 76 y.o. male with a past medical history listed below who presents to the emergency department after a witnessed fall where patient appeared to lose his balance, fall backwards onto dirt.  Patient lost consciousness for 2 to 3 minutes. Positive EtOH on board.  No anticoagulation.  He has been amnesic since.   Able to answer questions appropriately and has no physical complaints at this time including headache, neck pain, chest pain, back pain, abdominal pain, extremity pain.  The history is provided by the patient, a relative and the EMS personnel.    Past Medical History Past Medical History:  Diagnosis Date   Elevated liver function tests 15 years ago   in past, resolved   Erectile dysfunction    Hematoma of abdominal wall    subcutaneous, due to cough 12/09, hockey injury in 2005 as well   Hyperlipidemia    Hypertension    Neck pain    with cervical radiculopathy   OA (osteoarthritis)    left shoulder   RAS (renal artery stenosis) (HCC)    s/p right renal artery stent 8/10, 68m x 18 stent   Renal artery stenosis [facility]    Patient Active Problem List   Diagnosis Date Noted   Infection of left knee ([place] 10/11/2021   Erectile dysfunction    Essential hypertension, benign 05/23/2013   Atherosclerosis of renal artery ([place] 05/23/2013   Mixed hyperlipidemia 05/23/2013   Pure hypercholesterolemia 05/23/2013   Obstructive sleep apnea (adult) (pediatric) 05/23/2013   Home Medication(s) Prior to Admission medications   Medication Sig Start Date End Date Taking? Authorizing Provider  aspirin 81 MG tablet Take 81 mg by mouth every morning.     [provider]  atorvastatin (LIPITOR) 40 MG tablet Take 40 mg by mouth every morning.     [provider]  celecoxib (CELEBREX) 200 MG capsule  Take 200 mg by mouth daily.    [provider]  Coenzyme Q10 200 MG capsule Take 200 mg by mouth every morning.     [provider]  ibuprofen (ADVIL,MOTRIN) 200 MG tablet Take 400-600 mg by mouth daily as needed for moderate pain.     [provider]  Multiple Vitamin (MULTIVITAMINS PO) Take 1 tablet by mouth daily.     [provider]  sildenafil (REVATIO) 20 MG tablet Take 1 tablet (20 mg total) by mouth once as needed (erectile dysfunction.). 06/05/14   VJettie Booze MD  valsartan-hydrochlorothiazide (DIOVAN-HCT) 80-12.5 MG per tablet Take 1 tablet by mouth every morning.     [provider]  Allergies Lisinopril  Review of Systems Review of Systems As noted in HPI  Physical Exam Vital Signs  I have reviewed the triage vital signs BP 133/87   Pulse 79   Temp 98.3 F (36.8 C) (Oral)   Resp 16   Ht '5\' 9"'$  (1.753 m)   Wt 93 kg   SpO2 99%   BMI 30.28 kg/m   Physical Exam Vitals reviewed.  Constitutional:      General: He is not in acute distress.    Appearance: He is well-developed. He is not diaphoretic.  HENT:     Head: Normocephalic and atraumatic.     Right Ear: External ear normal.     Left Ear: External ear normal.     Nose: Nose normal.  Eyes:     General: No scleral icterus.       Right eye: No discharge.        Left eye: No discharge.     Conjunctiva/sclera: Conjunctivae normal.     Pupils: Pupils are equal, round, and reactive to light.  Cardiovascular:     Rate and Rhythm: Normal rate and regular rhythm.     Pulses:          Radial pulses are 2+ on the right side and 2+ on the left side.       Dorsalis pedis pulses are 2+ on the right side and 2+ on the left side.     Heart sounds: Normal heart sounds. No murmur heard.    No friction rub. No gallop.  Pulmonary:     Effort:  Pulmonary effort is normal. No respiratory distress.     Breath sounds: Normal breath sounds. No stridor. No rales.  Abdominal:     General: There is no distension.     Palpations: Abdomen is soft.     Tenderness: There is no abdominal tenderness.  Musculoskeletal:        General: No tenderness.     Cervical back: Normal range of motion and neck supple. No bony tenderness.     Thoracic back: No bony tenderness.     Lumbar back: No bony tenderness.     Comments: Clavicle stable. Chest stable to AP/Lat compression. Pelvis stable to Lat compression. No obvious extremity deformity. No chest or abdominal wall contusion.  Skin:    General: Skin is warm and dry.     Findings: No erythema or rash.  Neurological:     Mental Status: He is alert. He is disoriented.     GCS: GCS eye subscore is 4. GCS verbal subscore is 4. GCS motor subscore is 6.     Comments: Mental Status:  Alert and oriented to person, place, and time.  Attention and concentration normal.  Speech clear.  Recent memory is intact  Cranial Nerves:  II Visual Fields: Intact to confrontation. Visual fields intact. Kirk, IV, VI: Pupils equal and reactive to light and near. Full eye movement with lateral nystagmus  V Facial Sensation: Normal. No weakness of masticatory muscles  VII: No facial weakness or asymmetry  VIII Auditory Acuity: Grossly normal  IX/X: The uvula is midline; the palate elevates symmetrically  XI: Normal sternocleidomastoid and trapezius strength  XII: The tongue is midline. No atrophy or fasciculations.   Motor System: Muscle Strength: 5/5 and symmetric in the upper and lower extremities. No pronation or drift.  Muscle Tone: Tone and muscle bulk are normal in the upper and lower extremities.  Reflexes: No Clonus Coordination: Intact finger-to-nose. No  tremor.  Sensation: Intact to light touch Gait: deferred       ED Results and Treatments Labs (all labs ordered are listed, but only abnormal  results are displayed) Labs Reviewed  ETHANOL - Abnormal; Notable for the following components:      Result Value   Alcohol, Ethyl (B) 270 (*)    All other components within normal limits  CBC - Abnormal; Notable for the following components:   RBC 3.63 (*)    HCT 37.3 (*)    MCV 102.8 (*)    MCH 36.6 (*)    Platelets 105 (*)    All other components within normal limits  COMPREHENSIVE METABOLIC PANEL - Abnormal; Notable for the following components:   Chloride 96 (*)    CO2 20 (*)    Glucose, Bld 100 (*)    AST 64 (*)    ALT 58 (*)    Anion gap 19 (*)    All other components within normal limits  I-STAT CHEM 8, ED - Abnormal; Notable for the following components:   Sodium 133 (*)    Creatinine, Ser 1.30 (*)    Calcium, Ion 1.03 (*)    TCO2 19 (*)    All other components within normal limits  PROTIME-INR  APTT  DIFFERENTIAL  I-STAT CHEM 8, ED  TROPONIN I (HIGH SENSITIVITY)  TROPONIN I (HIGH SENSITIVITY)                                                                                                                         EKG  EKG Interpretation  Date/Time:  Thursday June 30 2022 23:51:42 EST Ventricular Rate:  70 PR Interval:  205 QRS Duration: 91 QT Interval:  400 QTC Calculation: 432 R Axis:   12 Text Interpretation: Sinus rhythm Minimal ST elevation, anterior leads No STEMI Confirmed by Addison Lank [phone]) on 07/01/2022 12:23:46 AM       Radiology CT Head Wo Contrast  Result Date: 07/01/2022 CLINICAL DATA:  This study identified as missing a report at 7:50 am on 07/01/2022. 76 year old male status post fall on blood thinners. Small right side subdural hematoma and right convexity subarachnoid hemorrhage yesterday. Subsequent encounter. EXAM: CT HEAD WITHOUT CONTRAST TECHNIQUE: Contiguous axial images were obtained from the base of the skull through the vertex without intravenous contrast. RADIATION DOSE REDUCTION: This exam was performed according to the  departmental dose-optimization program which includes automated exposure control, adjustment of the mA and/or kV according to patient size and/or use of iterative reconstruction technique. COMPARISON:  CT head and CTA head and neck yesterday. FINDINGS: Brain: Trace intraventricular hemorrhage adherent to the right septum pellucidum in the lateral ventricle is stable. No layering IVH. No ventriculomegaly. Minimal right hemisphere subarachnoid hemorrhage has decreased, primarily now confined to the right posterior sylvian fissure. There is a punctate shear hemorrhage versus SAH in the anterior right frontal lobe, stable and best seen on coronal image 17. Right lateral convexity hyperdense subdural hematoma measures 3-4  mm and is stable. No overlying skull fracture identified. No midline shift or intracranial mass effect. No new areas of intracranial hemorrhage. No cortically based acute infarct identified. Stable gray-white matter differentiation throughout the brain. Basilar cisterns remain normal. Vascular: Mild Calcified atherosclerosis at the skull base. No suspicious intracranial vascular hyperdensity. Skull: No skull fracture identified. Sinuses/Orbits: Visualized paranasal sinuses and mastoids are stable and well aerated. Other: No acute orbit or scalp soft tissue finding. IMPRESSION: 1. Stable small volume Right SDH (4 mm) since yesterday. No associated intracranial mass effect or midline shift. 2. Regressed small volume Subarachnoid Hemorrhage, now trace. And stable punctate anterior right frontal lobe SAH versus shear hemorrhage (series 3, image 15). 3. Stable trace IVH. 4. No new intracranial abnormality. Electronically Signed   By: Genevie Ann M.D.   On: 07/01/2022 07:54   CT C-SPINE NO CHARGE  Result Date: 07/01/2022 CLINICAL DATA:  Neck trauma, intoxicated or obtunded (Age >= 16y) EXAM: CT CERVICAL SPINE WITHOUT CONTRAST TECHNIQUE: Multidetector CT imaging of the cervical spine was performed without  intravenous contrast. Multiplanar CT image reconstructions were also generated. RADIATION DOSE REDUCTION: This exam was performed according to the departmental dose-optimization program which includes automated exposure control, adjustment of the mA and/or kV according to patient size and/or use of iterative reconstruction technique. COMPARISON:  CT angiography head and neck 06/29/2022 FINDINGS: Alignment: Reversal of the normal cervical lordosis centered at the C3-C4 level likely due to positioning and degenerative changes. Skull base and vertebrae: Multilevel at least moderate degenerative changes of the spine with associated multilevel severe osseous neural foraminal stenosis. No severe osseous central canal stenosis. No acute fracture. No aggressive appearing focal osseous lesion or focal pathologic process. Soft tissues and spinal canal: No prevertebral fluid or swelling. No visible canal hematoma. Upper chest: Unremarkable. Other: Atherosclerotic plaque of the aortic arch and its branches. IMPRESSION: 1. No acute displaced fracture or traumatic listhesis of the cervical spine. 2.  Aortic Atherosclerosis (ICD10-I70.0). Electronically Signed   By: Iven Finn M.D.   On: 07/01/2022 00:23   CT ANGIO HEAD NECK W WO CM  Result Date: 06/30/2022 CLINICAL DATA:  Fall; syncopal episode; blood thinners EXAM: CT ANGIOGRAPHY HEAD AND NECK TECHNIQUE: Multidetector CT imaging of the head and neck was performed using the standard protocol during bolus administration of intravenous contrast. Multiplanar CT image reconstructions and MIPs were obtained to evaluate the vascular anatomy. Carotid stenosis measurements (when applicable) are obtained utilizing NASCET criteria, using the distal internal carotid diameter as the denominator. RADIATION DOSE REDUCTION: This exam was performed according to the departmental dose-optimization program which includes automated exposure control, adjustment of the mA and/or kV according  to patient size and/or use of iterative reconstruction technique. CONTRAST:  3m OMNIPAQUE IOHEXOL 350 MG/ML SOLN COMPARISON:  None Available. FINDINGS: CT HEAD FINDINGS Brain: Hyperdense material along the posterior right cerebral convexity measuring up to 3 mm (series 9, image 50), with associated subarachnoid hemorrhage in the sulci of the posterior right frontal lobe (series 4, images 17-21). No significant mass effect or midline shift. No acute infarct, mass, or hydrocephalus. Cerebral volume is within normal limits for age. Vascular: No hyperdense vessel. Atherosclerotic calcifications in the intracranial carotid and vertebral arteries. Skull: Normal. Negative for fracture or focal lesion. Sinuses/Orbits: Minimal mucosal thickening in the maxillary sinuses. Status post bilateral lens replacements. Other: The mastoid air cells are well aerated. CTA NECK FINDINGS Aortic arch: Two-vessel arch with a common origin of the brachiocephalic and left common carotid arteries. Imaged  portion shows no evidence of aneurysm or dissection. No significant stenosis of the major arch vessel origins. Minimal aortic atherosclerotic disease. Right carotid system: No evidence of dissection, occlusion, or hemodynamically significant stenosis (greater than 50%). Atherosclerotic disease at the bifurcation and in the proximal ICA is not hemodynamically significant. Left carotid system: No evidence of dissection, occlusion, or hemodynamically significant stenosis (greater than 50%). Atherosclerotic disease at the bifurcation and in the proximal ICA is not hemodynamically significant. Vertebral arteries: No evidence of dissection, occlusion, or hemodynamically significant stenosis (greater than 50%). Skeleton: No acute osseous abnormality. Other neck: Negative. Upper chest: Negative. Review of the MIP images confirms the above findings CTA HEAD FINDINGS Anterior circulation: Both internal carotid arteries are patent to the termini,  without significant stenosis. A1 segments patent. Normal anterior communicating artery. Anterior cerebral arteries are patent to their distal aspects. No M1 stenosis or occlusion. MCA branches perfused and symmetric. Posterior circulation: Vertebral arteries patent to the vertebrobasilar junction without stenosis. Basilar patent to its distal aspect. Superior cerebellar arteries patent proximally. Patent P1 segments. PCAs perfused to their distal aspects without stenosis. The bilateral posterior communicating arteries are not visualized. Venous sinuses: As permitted by contrast timing, patent. Anatomic variants: None significant. Review of the MIP images confirms the above findings IMPRESSION: 1. Subdural hemorrhage along the posterior right cerebral convexity, measuring up to 3 mm, with associated subarachnoid hemorrhage in the sulci of the posterior right frontal lobe. No significant mass effect or midline shift. 2. No intracranial large vessel occlusion or significant stenosis. 3. No hemodynamically significant stenosis in the neck. These results were called by telephone at the time of interpretation on 06/30/2022 at 11:57 pm to provider [facility] , who verbally acknowledged these results. Electronically Signed   By: Merilyn Baba M.D.   On: 06/30/2022 23:58    Medications Ordered in ED Medications  iohexol (OMNIPAQUE) 350 MG/ML injection 75 mL (75 mLs Intravenous Contrast Given 06/30/22 2344)                                                                                                                                     Procedures .Critical Care  Performed by: Fatima Blank, MD Authorized by: Fatima Blank, MD   Critical care provider statement:    Critical care time (minutes):  80   Critical care time was exclusive of:  Separately billable procedures and treating other patients   Critical care was necessary to treat or prevent imminent or life-threatening deterioration of  the following conditions:  CNS failure or compromise   Critical care was time spent personally by me on the following activities:  Development of treatment plan with patient or surrogate, discussions with consultants, evaluation of patient's response to treatment, examination of patient, obtaining history from patient or surrogate, review of old charts, re-evaluation of patient's condition, pulse oximetry, ordering and review of radiographic studies, ordering and review of laboratory studies  and ordering and performing treatments and interventions   (including critical care time)  Medical Decision Making / ED Course   Medical Decision Making Amount and/or Complexity of Data Reviewed Independent Historian: EMS    Details: EMS provided initial history Son arrived later and was present for the event. Confirms that patient lost his balance while standing on the driveway and fell hard on the dirt. He was conscience, but unresponsive for several minutes. Son also reports that patient had quite a bit to drink and usually drinks daily. Labs: ordered. Decision-making details documented in ED Course. Radiology: ordered and independent interpretation performed. Decision-making details documented in ED Course. ECG/medicine tests: ordered and independent interpretation performed. Decision-making details documented in ED Course.  Risk Prescription drug management.  Fall while intoxicated with amnesia Made a level 2 trauma upon arrival No significant injuries noted on exam but patient will require CT head and cervical spine to rule out acute injuries.  We will also obtain a CT angio to rule out vascular occlusion or dissection that would have precipitated the fall. We will get labs to assess for any electrolyte or metabolic derangements, severe anemia, level of alcohol intoxication.  We will also rule out dysrhythmias and cardiac etiology.  EKG had mild ST segment elevation in anterior leads but it did not  meet STEMI criteria. Initial troponin negative CBC without leukocytosis or anemia Metabolic panel without significant electrolyte derangements.  Mild renal insufficiency. EtOH of 270.  Clinically, I cannot repeat blood pressure 97/6575 avoidances past.  CT head notable for small right occipital subdural and right frontal lobe subarachnoid hemorrhages.  CTA negative for any vascular occlusion or dissection.  CT of the cervical spine negative for any fractures.  Consulting Dr. Reatha Armour from [place]. He recommended repeating CT head later this morning after overnight observation while we continue the work up in the ED. If CT is stable, patient can follow up in clinic. If there bleeding is increased, will discuss possible admission.  0100: On reassessment patient's mentation is improving. Patient and family updated on plan.   0530: Patient's mental status has significantly improved, he is clinically sober oriented x4.  8:12 AM CT head notable for stable subdural and resolving subarachnoid.     Final Clinical Impression(s) / ED Diagnoses Final diagnoses:  Subdural hematoma due to concussion, with loss of consciousness of 30 minutes or less, sequela (HCC)  Subarachnoid hematoma, with loss of consciousness of 30 minutes or less, initial encounter ([place])  Alcoholic intoxication without complication ([place])   The patient appears reasonably screened and/or stabilized for discharge and I doubt any other medical condition or other [facility] requiring further screening, evaluation, or treatment in the ED at this time. I have discussed the findings, Dx and Tx plan with the patient/family who expressed understanding and agree(s) with the plan. Discharge instructions discussed at length. The patient/family was given strict return precautions who verbalized understanding of the instructions. No further questions at time of discharge.  Disposition: Discharge  Condition: Good  ED Discharge Orders     None         Follow Up: Dawley, Theodoro Doing, DO [address] [place] [address] [place] [address] [phone]  Call  to schedule an appointment for close follow up  Pa, [place] [address] [phone]  Call  to schedule an appointment for close follow up           This chart was dictated using voice recognition software.  Despite best efforts to proofread,  errors can occur which can change the documentation meaning.    Fatima Blank, MD 07/01/22 [phone]

## 2022-06-30 NOTE — ED Triage Notes (Signed)
Pt BIB EMS. Per EMS, pt had witnessed fall today at a party. Pt did have syncopal episode, tho unsure if it happened before or after fall. Pt fell on right side. Unsure if head was hit. Pt on blood thinners. Pt does have new retrograde amnesia.

## 2022-07-01 ENCOUNTER — Emergency Department (HOSPITAL_COMMUNITY): Payer: 59

## 2022-07-01 LAB — COMPREHENSIVE METABOLIC PANEL
ALT: 58 U/L — ABNORMAL HIGH (ref 0–44)
AST: 64 U/L — ABNORMAL HIGH (ref 15–41)
Albumin: 4.1 g/dL (ref 3.5–5.0)
Alkaline Phosphatase: 68 U/L (ref 38–126)
Anion gap: 19 — ABNORMAL HIGH (ref 5–15)
BUN: 17 mg/dL (ref 8–23)
CO2: 20 mmol/L — ABNORMAL LOW (ref 22–32)
Calcium: 10 mg/dL (ref 8.9–10.3)
Chloride: 96 mmol/L — ABNORMAL LOW (ref 98–111)
Creatinine, Ser: 1.01 mg/dL (ref 0.61–1.24)
GFR, Estimated: >60 mL/min (ref >60)
Glucose, Bld: 100 mg/dL — ABNORMAL HIGH (ref 70–99)
Potassium: 3.7 mmol/L (ref 3.5–5.1)
Sodium: 135 mmol/L (ref 135–145)
Total Bilirubin: 0.6 mg/dL (ref 0.3–1.2)
Total Protein: 6.8 g/dL (ref 6.5–8.1)

## 2022-07-01 LAB — TROPONIN I (HIGH SENSITIVITY)
Troponin I (High Sensitivity): 8 ng/L (ref <18)
Troponin I (High Sensitivity): 10 ng/L (ref <18)

## 2022-07-01 NOTE — ED Notes (Signed)
Trauma Response Nurse Documentation   Debby Bud III is a 76 y.o. male arriving to [facility] ED via EMS  On aspirin 81 mg daily. Trauma was activated as a Level 2 by ED charge RN based on the following trauma criteria GCS 10-14 associated with trauma or AVPU < A. Trauma team at the bedside on patient arrival.   Patient cleared for CT by Dr. Leonette Monarch EDP. Pt transported to CT with trauma response nurse present to monitor. RN remained with the patient throughout their absence from the department for clinical observation.   GCS 14 - confused.  History   Past Medical History:  Diagnosis Date   Elevated liver function tests 15 years ago   in past, resolved   Erectile dysfunction    Hematoma of abdominal wall    subcutaneous, due to cough 12/09, hockey injury in 2005 as well   Hyperlipidemia    Hypertension    Neck pain    with cervical radiculopathy   OA (osteoarthritis)    left shoulder   RAS (renal artery stenosis) (HCC)    s/p right renal artery stent 8/10, 6m x 18 stent   Renal artery stenosis ([facility]      Past Surgical History:  Procedure Laterality Date   COLONOSCOPY WITH PROPOFOL N/A 10/28/2014   Procedure: COLONOSCOPY WITH PROPOFOL;  Surgeon: MGarlan Fair MD;  Location: [facility] ENDOSCOPY;  Service: Endoscopy;  Laterality: N/A;   COLONOSCOPY WITH PROPOFOL N/A 07/28/2015   Procedure: COLONOSCOPY WITH PROPOFOL;  Surgeon: MGarlan Fair MD;  Location: [facility] ENDOSCOPY;  Service: Endoscopy;  Laterality: N/A;   I & D EXTREMITY Left 10/11/2021   Procedure: IRRIGATION AND DEBRIDEMENT LEFT KNEE BY ARTHROSCOPY;  Surgeon: OParalee Cancel MD;  Location: [facility] ORS;  Service: Orthopedics;  Laterality: Left;  671MINS   plantar fibroma     resection   RENAL ARTERY STENT Right    TONSILLECTOMY AND ADENOIDECTOMY         Initial Focused Assessment (If applicable, or please see trauma documentation): Alert but confused male presents after a fall at a party, ETOH on board, retrograde amnesia  since event Airway patent/unobstructed, BS clear No obvious uncontrolled hemorrhage, no external injuries noted GCS 14 ([id number])  CT's Completed:   CT Head, CT C-Spine, and CT Angio Head   Interventions:  IV start and trauma lab draw CT head, C-spine, CT angio head/neck XRAYS deferred by Dr. CLeonette MonarchEKG  Plan for disposition:  Admission to ICU   Consults completed:  Neurosurgeon at [place]  Event Summary: Pt arrives via EMS after a syncopal episode, reports falling and stated that he did not hit his head. Fall onto right side of his body. No external trauma noted. Retrograde amnesia since fall. Takes aspirin.  MTP Summary (If applicable): NA  Bedside handoff with ED RN KHal Hope    Cannon Arreola O Shekira Drummer  Trauma Response RN  Please call TRN at [phone] further assistance.

## 2022-07-01 NOTE — Discharge Instructions (Addendum)
Please hold your aspirin and other NSAIDs until cleared by neurosurgery.

## 2022-07-01 NOTE — TOC CAGE-AID Note (Signed)
Transition of Care [facility]) - CAGE-AID Screening   Patient Details  Name: Aaron Kirk MRN: [id number] Date of Birth: 26-Apr-1946  Transition of [facility]) CM/SW Contact:    Army Melia, RN Phone Number:[phone] 07/01/2022, 1:14 AM   Clinical Narrative:  Presents after a fall with subdural and subarachnoid hemorrhage, retrograde amnesia. Unable to complete screening d/t amnesia.   CAGE-AID Screening: Substance Abuse Screening unable to be completed due to: : Patient unable to participate (pt with retrograde amneisa)

## 2022-07-01 NOTE — ED Notes (Signed)
Patient transported to CT 

## 2023-07-15 IMAGING — US US DRAIN/INJ SMALL JOINT/BURSA
1 series · 12 of 12 positions shown · non-contrast
Comparison: none

INDICATION: Left knee pain, complex bakers cyst

[Series 1: us drain/inj small joint/bursa · 0.08mm/px · 12 acquisitions, 12 frames shown]
[im 1/12]
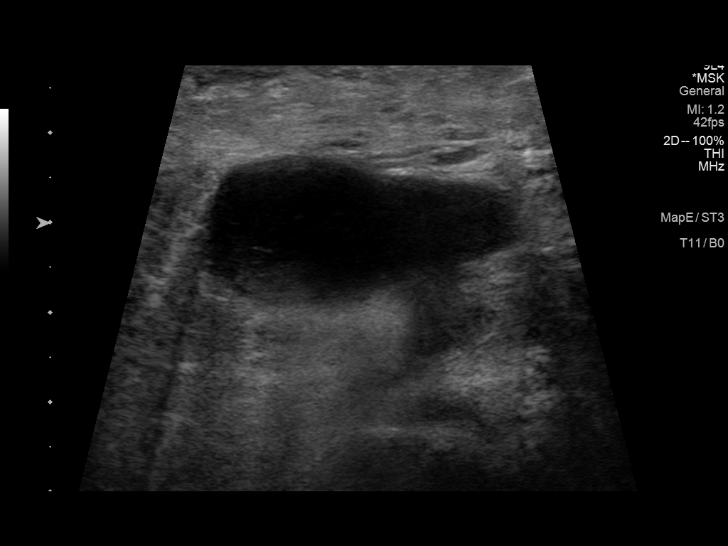
[im 2/12]
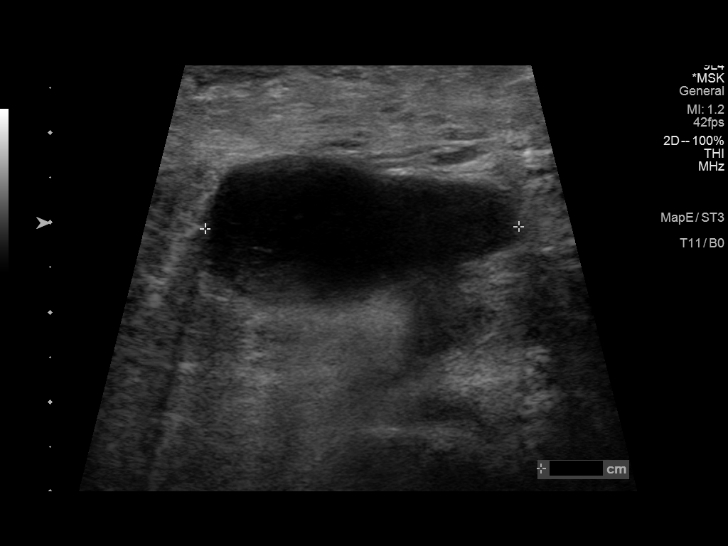
[im 3/12]
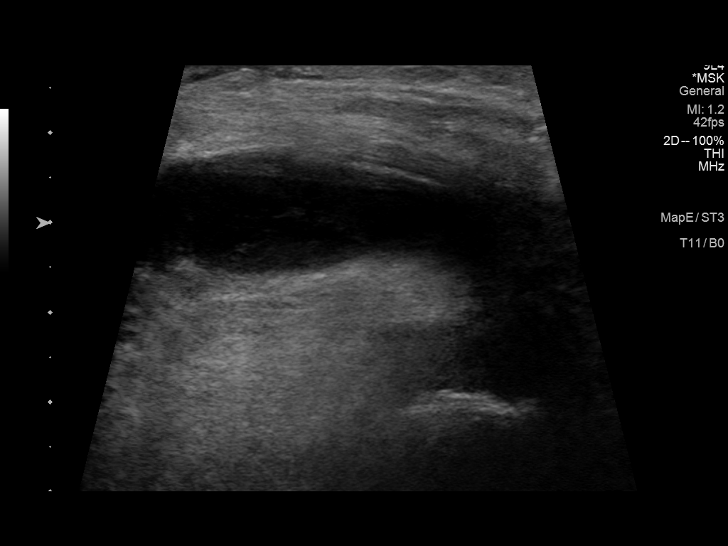
[im 4/12]
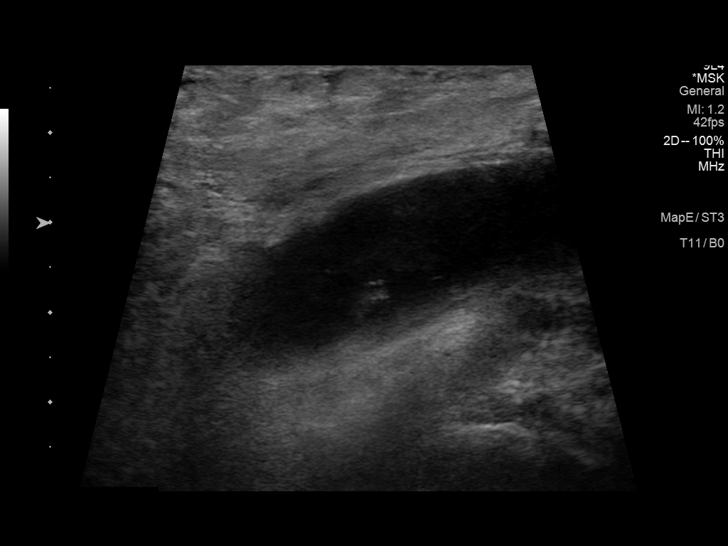
[im 5/12]
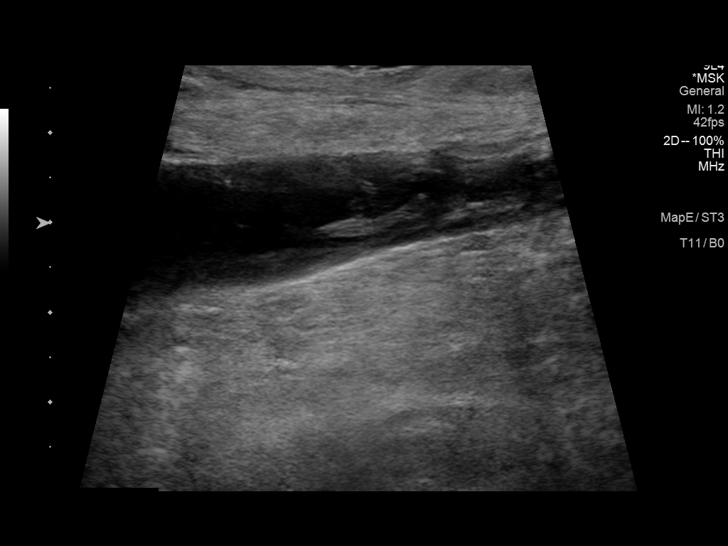
[im 6/12]
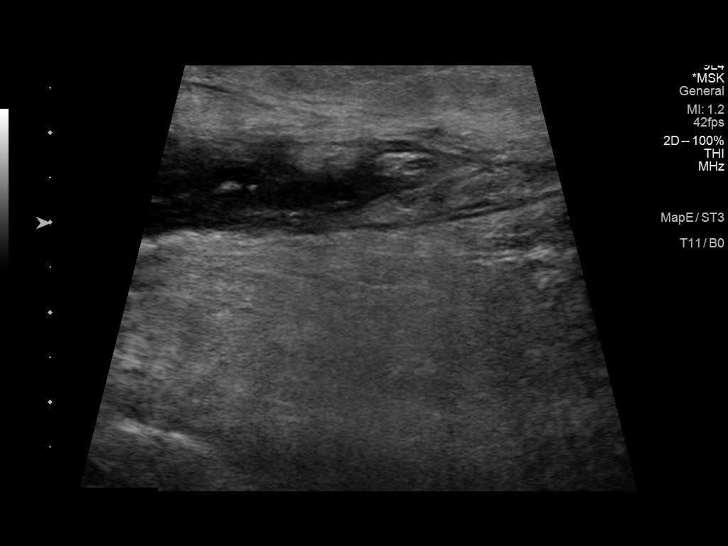
[im 7/12]
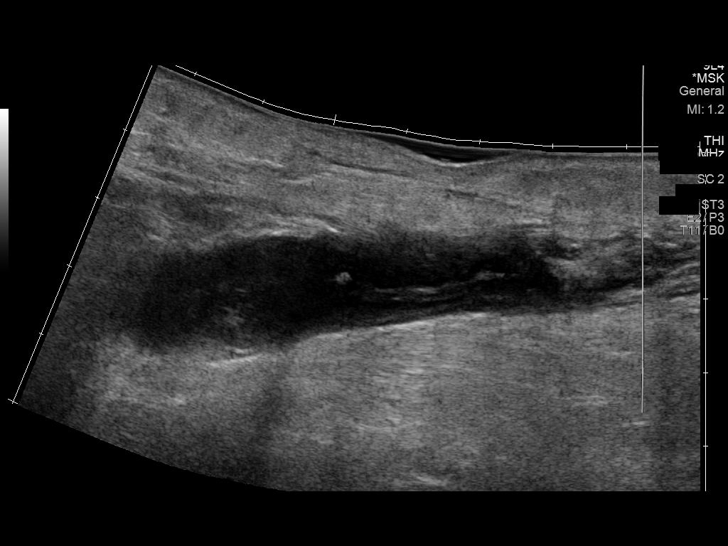
[im 8/12]
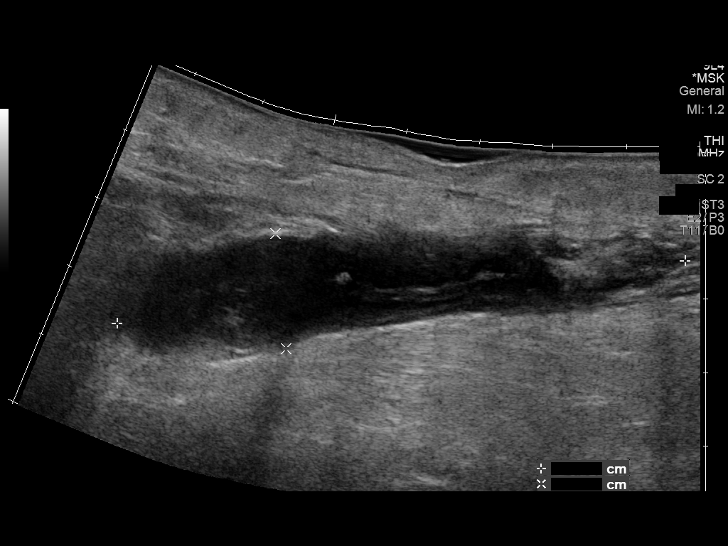
[im 9/12]
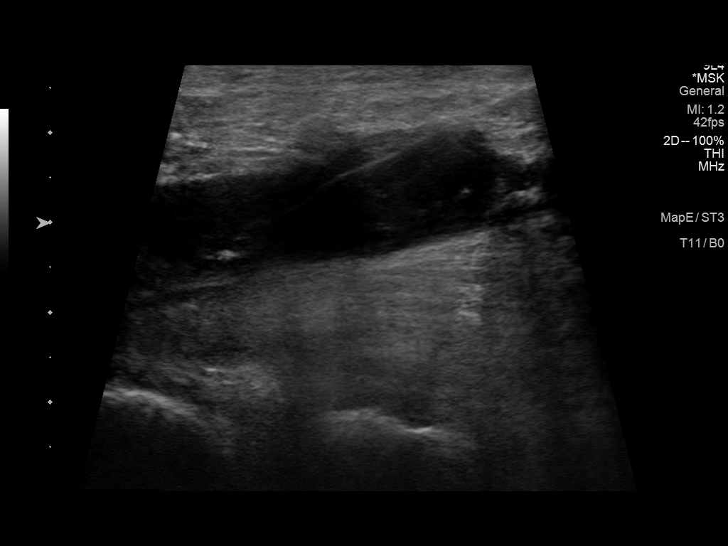
[im 10/12]
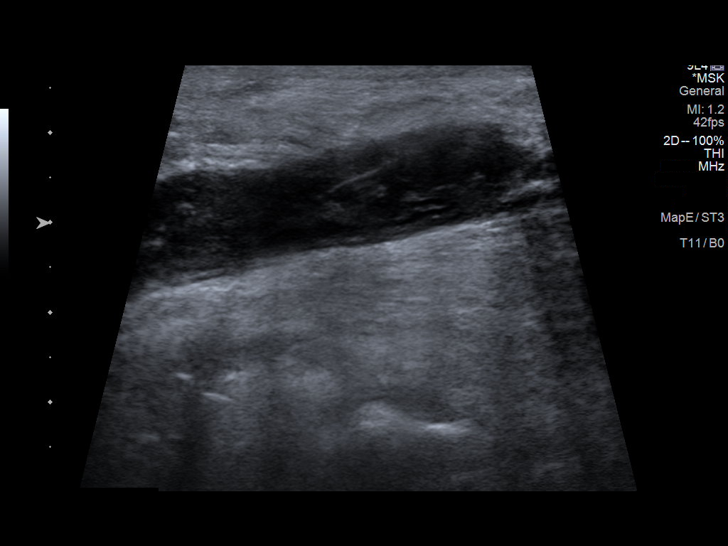
[im 11/12]
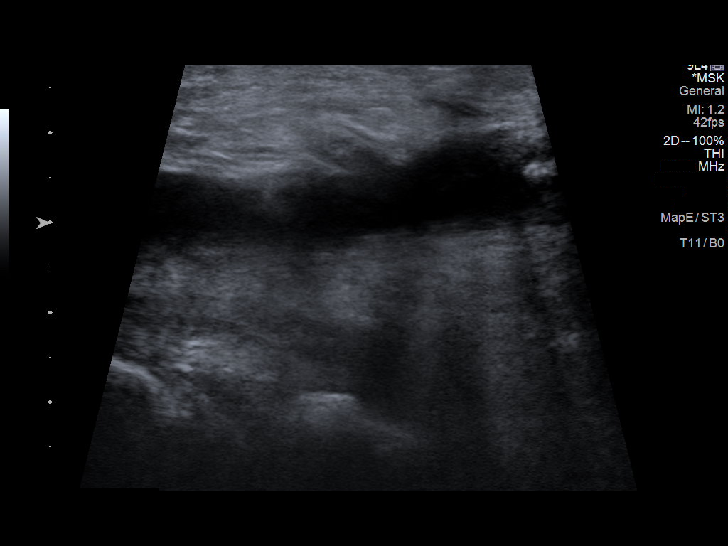
[im 12/12]
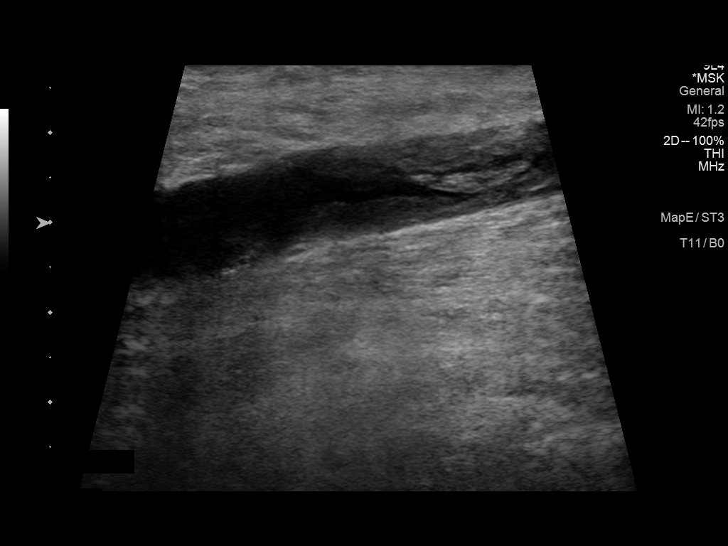

[12 of 12 positions shown; findings below may reference images not displayed]

EXAM:
ULTRASOUND ASPIRATION LEFT POPLITEAL FOSSA BAKER'S CYST

MEDICATIONS:
1% LIDOCAINE LOCAL

ANESTHESIA/SEDATION:
None.

COMPLICATIONS:
None immediate.

PROCEDURE:
Informed written consent was obtained from the patient after a
thorough discussion of the procedural risks, benefits and
alternatives. All questions were addressed. Maximal Sterile Barrier
Technique was utilized including caps, mask, sterile gowns, sterile
gloves, sterile drape, hand hygiene and skin antiseptic. A timeout
was performed prior to the initiation of the procedure.

Previous imaging reviewed. Preliminary ultrasound performed. Complex
left popliteal fossa Baker's cyst was localized and marked.

Under sterile conditions and local anesthesia, an 18 gauge needle
was advanced into the Baker's cyst. Needle position confirmed with
ultrasound. Syringe aspiration yielded proximally 5 cc cloudy
exudative fluid and slightly blood tinged. Sample sent for culture
and laboratory analysis. Needle removed. Postprocedure imaging
demonstrates no hemorrhage or hematoma. Because of the complexity of
the Baker's cyst complete aspiration was not able to be performed.
IMPRESSION: Successful ultrasound left Baker's cyst aspiration for diagnostic
laboratory sample.

## 2024-05-22 NOTE — H&P (Signed)
 Patient's anticipated LOS is less than 2 midnights, meeting these requirements: - Younger than 57 - Lives within 1 hour of care - Has a competent adult at home to recover with post-op recover - NO history of  - Chronic pain requiring opiods  - Diabetes  - Coronary Artery Disease  - Heart failure  - Heart attack  - Stroke  - DVT/VTE  - Cardiac arrhythmia  - Respiratory Failure/COPD  - Renal failure  - Anemia  - Advanced Liver disease     Aaron Kirk is an 78 y.o. male.    Chief Complaint: right shoulder pain  HPI: Pt is a 78 y.o. male complaining of right shoulder pain for multiple years. Pain had continually increased since the beginning. X-rays in the clinic show end-stage arthritic changes of the right shoulder. Pt has tried various conservative treatments which have failed to alleviate their symptoms, including injections and therapy. Various options are discussed with the patient. Risks, benefits and expectations were discussed with the patient. Patient understand the risks, benefits and expectations and wishes to proceed with surgery.   PCP:  Pa, [facility]  D/C Plans: Home  PMH: Past Medical History:  Diagnosis Date   Elevated liver function tests 15 years ago   in past, resolved   Erectile dysfunction    Hematoma of abdominal wall    subcutaneous, due to cough 12/09, hockey injury in 2005 as well   Hyperlipidemia    Hypertension    Neck pain    with cervical radiculopathy   OA (osteoarthritis)    left shoulder   RAS (renal artery stenosis)    s/p right renal artery stent 8/10, 7mm x 18 stent   Renal artery stenosis     PSH: Past Surgical History:  Procedure Laterality Date   COLONOSCOPY WITH PROPOFOL  N/A 10/28/2014   Procedure: COLONOSCOPY WITH PROPOFOL ;  Surgeon: Gladis MARLA Louder, MD;  Location: [facility];  Service: Endoscopy;  Laterality: N/A;   COLONOSCOPY WITH PROPOFOL  N/A 07/28/2015   Procedure: COLONOSCOPY WITH  PROPOFOL ;  Surgeon: Gladis MARLA Louder, MD;  Location: [facility];  Service: Endoscopy;  Laterality: N/A;   I & D EXTREMITY Left 10/11/2021   Procedure: IRRIGATION AND DEBRIDEMENT LEFT KNEE BY ARTHROSCOPY;  Surgeon: Ernie Cough, MD;  Location: [facility];  Service: Orthopedics;  Laterality: Left;  60 MINS   plantar fibroma     resection   RENAL ARTERY STENT Right    TONSILLECTOMY AND ADENOIDECTOMY      Social History:  reports that he quit smoking about 45 years ago. His smoking use included cigarettes. He started smoking about 55 years ago. He has a 15 pack-year smoking history. He has never used smokeless tobacco. He reports current alcohol use. He reports that he does not use drugs. BMI: Estimated body mass index is 30.28 kg/m as calculated from the following:   Height as of 06/30/22: 5' 9 (1.753 m).   Weight as of 06/30/22: 93 kg.  Lab Results  Component Value Date   ALBUMIN 4.1 06/30/2022   Diabetes: Patient does not have a diagnosis of diabetes.     Smoking Status:   reports that he quit smoking about 45 years ago. His smoking use included cigarettes. He started smoking about 55 years ago. He has a 15 pack-year smoking history. He has never used smokeless tobacco.    Allergies:  Allergies  Allergen Reactions   Lisinopril Cough    Tolerates valsartan    Medications: No  current facility-administered medications for this encounter.   Current Outpatient Medications  Medication Sig Dispense Refill   aspirin 81 MG tablet Take 81 mg by mouth every morning.      atorvastatin (LIPITOR) 40 MG tablet Take 40 mg by mouth every morning.      celecoxib (CELEBREX) 200 MG capsule Take 200 mg by mouth daily.     Coenzyme Q10 200 MG capsule Take 200 mg by mouth every morning.      ibuprofen (ADVIL,MOTRIN) 200 MG tablet Take 400-600 mg by mouth daily as needed for moderate pain.      Multiple Vitamin (MULTIVITAMINS PO) Take 1 tablet by mouth daily.      sildenafil  (REVATIO ) 20 MG tablet  Take 1 tablet (20 mg total) by mouth once as needed (erectile dysfunction.). 50 tablet 6   valsartan-hydrochlorothiazide (DIOVAN-HCT) 80-12.5 MG per tablet Take 1 tablet by mouth every morning.       No results found for this or any previous visit (from the past 48 hours). No results found.  ROS: Pain with rom of the right upper extremity  Physical Exam: Alert and oriented 78 y.o. male in no acute distress Cranial nerves 2-12 intact Cervical spine: full rom with no tenderness, nv intact distally Chest: active breath sounds bilaterally, no wheeze rhonchi or rales Heart: regular rate and rhythm, no murmur Abd: non tender non distended with active bowel sounds Hip is stable with rom  Right shoulder painful and weak rom Nv intact distally No rashes or edema distally  Assessment/Plan Assessment: right shoulder cuff arthropathy  Plan:  Patient will undergo a right reverse total shoulder by Dr. Kay at [place] Risks benefits and expectations were discussed with the patient. Patient understand risks, benefits and expectations and wishes to proceed. Preoperative templating of the joint replacement has been completed, documented, and submitted to the Operating Room personnel in order to optimize intra-operative equipment management.   Arvella Fireman PA-C, [facility] is now [employer] [address], [address], [place], [place] [address] Phone: [phone] [email or web address] Facebook  [employer]

## 2024-05-29 ENCOUNTER — Other Ambulatory Visit (HOSPITAL_COMMUNITY)

## 2024-05-29 NOTE — Patient Instructions (Signed)
 SURGICAL WAITING ROOM VISITATION  Patients having surgery or a procedure may have no more than 2 support people in the waiting area - these visitors may rotate.    Children under the age of 52 must have an adult with them who is not the patient.  Visitors with respiratory illnesses are discouraged from visiting and should remain at home.  If the patient needs to stay at the hospital during part of their recovery, the visitor guidelines for inpatient rooms apply. Pre-op nurse will coordinate an appropriate time for 1 support person to accompany patient in pre-op.  This support person may not rotate.    Please refer to the [facility] website for the visitor guidelines for Inpatients (after your surgery is over and you are in a regular room).    Your procedure is scheduled on: 06/07/24   Report to [facility] Main Entrance    Report to admitting at 8:35 AM   Call this number if you have problems the morning of surgery [phone]   Do not eat food :After Midnight.   After Midnight you may have the following liquids until 8:05 AM DAY OF SURGERY  Water Non-Citrus Juices (without pulp, NO RED-Apple, White grape, White cranberry) Speich Coffee (NO MILK/CREAM OR CREAMERS, sugar ok)  Clear Tea (NO MILK/CREAM OR CREAMERS, sugar ok) regular and decaf                             Plain Jell-O (NO RED)                                           Fruit ices (not with fruit pulp, NO RED)                                     Popsicles (NO RED)                                                               Sports drinks like Gatorade (NO RED)               The day of surgery:  Drink ONE (1) Pre-Surgery Clear Ensure at 8:05 AM the morning of surgery. Drink in one sitting. Do not sip.  This drink was given to you during your hospital  pre-op appointment visit. Nothing else to drink after completing the  Pre-Surgery Clear Ensure.          If you have questions, please contact your surgeon's  office.   FOLLOW BOWEL PREP AND ANY ADDITIONAL PRE OP INSTRUCTIONS YOU RECEIVED FROM YOUR SURGEON'S OFFICE!!!     Oral Hygiene is also important to reduce your risk of infection.                                    Remember - BRUSH YOUR TEETH THE MORNING OF SURGERY WITH YOUR REGULAR TOOTHPASTE  DENTURES WILL BE REMOVED PRIOR TO SURGERY PLEASE DO NOT APPLY Poly grip OR ADHESIVES!!!  Stop all vitamins and herbal supplements 7 days before surgery.   Take these medicines the morning of surgery with A SIP OF WATER: Atorvastatin                              You may not have any metal on your body including jewelry, and body piercing             Do not wear lotions, powders, cologne, or deodorant              Men may shave face and neck.   Do not bring valuables to the hospital. [facility] IS NOT             RESPONSIBLE   FOR VALUABLES.   Contacts, glasses, dentures or bridgework may not be worn into surgery.   Bring small overnight bag day of surgery.   DO NOT BRING YOUR HOME MEDICATIONS TO THE HOSPITAL. PHARMACY WILL DISPENSE MEDICATIONS LISTED ON YOUR MEDICATION LIST TO YOU DURING YOUR ADMISSION IN THE HOSPITAL!    Special Instructions: Bring a copy of your healthcare power of attorney and living will documents the day of surgery if you haven't scanned them before.              Please read over the following fact sheets you were given: IF YOU HAVE QUESTIONS ABOUT YOUR PRE-OP INSTRUCTIONS PLEASE CALL [phone] [place].   If you received a COVID test during your pre-op visit  it is requested that you wear a mask when out in public, stay away from anyone that may not be feeling well and notify your surgeon if you develop symptoms. If you test positive for Covid or have been in contact with anyone that has tested positive in the last 10 days please notify you surgeon.      Pre-operative 4 CHG Bath Instructions  DYNA-Hex 4 Chlorhexidine  Gluconate 4% Solution Antiseptic 4 fl.  oz   You can play a key role in reducing the risk of infection after surgery. Your skin needs to be as free of germs as possible. You can reduce the number of germs on your skin by washing with CHG (chlorhexidine  gluconate) soap before surgery. CHG is an antiseptic soap that kills germs and continues to kill germs even after washing.   DO NOT use if you have an allergy to chlorhexidine /CHG or antibacterial soaps. If your skin becomes reddened or irritated, stop using the CHG and notify one of our RNs at   Please shower with the CHG soap starting 4 days before surgery using the following schedule:     Please keep in mind the following:  DO NOT shave, including legs and underarms, starting the day of your first shower.   You may shave your face at any point before/day of surgery.  Place clean sheets on your bed the day you start using CHG soap. Use a clean washcloth (not used since being washed) for each shower. DO NOT sleep with pets once you start using the CHG.  CHG Shower Instructions:  If you choose to wash your hair and private area, wash first with your normal shampoo/soap.  After you use shampoo/soap, rinse your hair and body thoroughly to remove shampoo/soap residue.  Turn the water OFF and apply about 3 tablespoons (45 ml) of CHG soap to a CLEAN washcloth.  Apply CHG soap ONLY FROM YOUR NECK DOWN TO YOUR TOES (washing for 3-5 minutes)  DO NOT use CHG soap on face, private areas, open wounds, or sores.  Pay special attention to the area where your surgery is being performed.  If you are having back surgery, having someone wash your back for you may be helpful. Wait 2 minutes after CHG soap is applied, then you may rinse off the CHG soap.  Pat dry with a clean towel  Put on clean clothes/pajamas   If you choose to wear lotion, please use ONLY the CHG-compatible lotions on the back of this paper.     Additional instructions for the day of surgery: DO NOT APPLY any lotions,  deodorants, cologne, or perfumes.   Put on clean/comfortable clothes.  Brush your teeth.  Ask your nurse before applying any prescription medications to the skin.   CHG Compatible Lotions   Aveeno Moisturizing lotion  Cetaphil Moisturizing Cream  Cetaphil Moisturizing Lotion  Clairol Herbal Essence Moisturizing Lotion, Dry Skin  Clairol Herbal Essence Moisturizing Lotion, Extra Dry Skin  Clairol Herbal Essence Moisturizing Lotion, Normal Skin  Curel Age Defying Therapeutic Moisturizing Lotion with Alpha Hydroxy  Curel Extreme Care Body Lotion  Curel Soothing Hands Moisturizing Hand Lotion  Curel Therapeutic Moisturizing Cream, Fragrance-Free  Curel Therapeutic Moisturizing Lotion, Fragrance-Free  Curel Therapeutic Moisturizing Lotion, Original Formula  Eucerin Daily Replenishing Lotion  Eucerin Dry Skin Therapy Plus Alpha Hydroxy Crme  Eucerin Dry Skin Therapy Plus Alpha Hydroxy Lotion  Eucerin Original Crme  Eucerin Original Lotion  Eucerin Plus Crme Eucerin Plus Lotion  Eucerin TriLipid Replenishing Lotion  Keri Anti-Bacterial Hand Lotion  Keri Deep Conditioning Original Lotion Dry Skin Formula Softly Scented  Keri Deep Conditioning Original Lotion, Fragrance Free Sensitive Skin Formula  Keri Lotion Fast Absorbing Fragrance Free Sensitive Skin Formula  Keri Lotion Fast Absorbing Softly Scented Dry Skin Formula  Keri Original Lotion  Keri Skin Renewal Lotion Keri Silky Smooth Lotion  Keri Silky Smooth Sensitive Skin Lotion  Nivea Body Creamy Conditioning Oil  Nivea Body Extra Enriched Lotion  Nivea Body Original Lotion  Nivea Body Sheer Moisturizing Lotion Nivea Crme  Nivea Skin Firming Lotion  NutraDerm 30 Skin Lotion  NutraDerm Skin Lotion  NutraDerm Therapeutic Skin Cream  NutraDerm Therapeutic Skin Lotion  ProShield Protective Hand Cream  Provon moisturizing lotion  [place]- Preparing for Total Shoulder Arthroplasty    Before surgery, you can play an  important role. Because skin is not sterile, your skin needs to be as free of germs as possible. You can reduce the number of germs on your skin by using the following products. Benzoyl Peroxide Gel Reduces the number of germs present on the skin Applied twice a day to shoulder area starting two days before surgery    ==================================================================  Please follow these instructions carefully:  BENZOYL PEROXIDE 5% GEL  Please do not use if you have an allergy to benzoyl peroxide.   If your skin becomes reddened/irritated stop using the benzoyl peroxide.  Starting two days before surgery, apply as follows: Apply benzoyl peroxide in the morning and at night. Apply after taking a shower. If you are not taking a shower clean entire shoulder front, back, and side along with the armpit with a clean wet washcloth.  Place a quarter-sized dollop on your shoulder and rub in thoroughly, making sure to cover the front, back, and side of your shoulder, along with the armpit.   2 days before ____ AM   ____ PM  1 day before ____ AM   ____ PM                         Do this twice a day for two days.  (Last application is the night before surgery, AFTER using the CHG soap as described below).  Do NOT apply benzoyl peroxide gel on the day of surgery.  Incentive Spirometer  An incentive spirometer is a tool that can help keep your lungs clear and active. This tool measures how well you are filling your lungs with each breath. Taking long deep breaths may help reverse or decrease the chance of developing breathing (pulmonary) problems (especially infection) following: A long period of time when you are unable to move or be active. BEFORE THE PROCEDURE  If the spirometer includes an indicator to show your best effort, your nurse or respiratory therapist will set it to a desired goal. If possible, sit up straight or lean slightly forward. Try not to  slouch. Hold the incentive spirometer in an upright position. INSTRUCTIONS FOR USE  Sit on the edge of your bed if possible, or sit up as far as you can in bed or on a chair. Hold the incentive spirometer in an upright position. Breathe out normally. Place the mouthpiece in your mouth and seal your lips tightly around it. Breathe in slowly and as deeply as possible, raising the piston or the ball toward the top of the column. Hold your breath for 3-5 seconds or for as long as possible. Allow the piston or ball to fall to the bottom of the column. Remove the mouthpiece from your mouth and breathe out normally. Rest for a few seconds and repeat Steps 1 through 7 at least 10 times every 1-2 hours when you are awake. Take your time and take a few normal breaths between deep breaths. The spirometer may include an indicator to show your best effort. Use the indicator as a goal to work toward during each repetition. After each set of 10 deep breaths, practice coughing to be sure your lungs are clear. If you have an incision (the cut made at the time of surgery), support your incision when coughing by placing a pillow or rolled up towels firmly against it. Once you are able to get out of bed, walk around indoors and cough well. You may stop using the incentive spirometer when instructed by your caregiver.  RISKS AND COMPLICATIONS Take your time so you do not get dizzy or light-headed. If you are in pain, you may need to take or ask for pain medication before doing incentive spirometry. It is harder to take a deep breath if you are having pain. AFTER USE Rest and breathe slowly and easily. It can be helpful to keep track of a log of your progress. Your caregiver can provide you with a simple table to help with this. If you are using the spirometer at home, follow these instructions: SEEK MEDICAL CARE IF:  You are having difficultly using the spirometer. You have trouble using the spirometer as often as  instructed. Your pain medication is not giving enough relief while using the spirometer. You develop fever of 100.5 F (38.1 C) or higher. SEEK IMMEDIATE MEDICAL CARE IF:  You cough up bloody sputum that had not been present before. You develop fever of 102 F (38.9 C) or greater. You develop worsening pain at or near the incision site. MAKE SURE YOU:  Understand these instructions. Will  watch your condition. Will get help right away if you are not doing well or get worse. Document Released: 12/05/2006 Document Revised: 10/17/2011 Document Reviewed: 02/05/2007 [facility] Patient Information 2014 [place], [place].   ________________________________________________________________________

## 2024-05-29 NOTE — Progress Notes (Signed)
 COVID Vaccine Completed: yes  Date of COVID positive in last 90 days:  PCP - [facility]- Charlie Reas, MD Cardiologist - Candyce Reek, MD LOV 2015 for   Chest x-ray - N/A EKG - 05/30/24 Epic/chart Stress Test - years ago per pt, was okay ECHO - N/A Cardiac Cath - n/a Pacemaker/ICD device last checked:N/A Spinal Cord Stimulator:N/A  Bowel Prep - N/A  Sleep Study - yes, mild CPAP - no per pt does not need  Fasting Blood Sugar - N/A Checks Blood Sugar _____ times a day  Last dose of GLP1 agonist-  N/A GLP1 instructions:  Do not take after     Last dose of SGLT-2 inhibitors-  N/A SGLT-2 instructions:  Do not take after     Blood Thinner Instructions: N/A Last dose:   Time: Aspirin Instructions:N/A Last Dose:  Activity level: Can go up a flight of stairs and perform activities of daily living without stopping and without symptoms of chest pain or shortness of breath.  Anesthesia review: subdural hematoma, HTN, OSA  Patient denies shortness of breath, fever, cough and chest pain at PAT appointment  Patient verbalized understanding of instructions that were given to them at the PAT appointment. Patient was also instructed that they will need to review over the PAT instructions again at home before surgery.

## 2024-05-30 ENCOUNTER — Encounter (HOSPITAL_COMMUNITY): Payer: Self-pay

## 2024-05-30 ENCOUNTER — Other Ambulatory Visit: Payer: Self-pay

## 2024-05-30 ENCOUNTER — Encounter (HOSPITAL_COMMUNITY)
Admission: RE | Admit: 2024-05-30 | Discharge: 2024-05-30 | Disposition: A | Discharge status: Home / Self Care | Source: Ambulatory Visit | Attending: Orthopedic Surgery | Admitting: Orthopedic Surgery

## 2024-05-30 VITALS — BP 159/99 | HR 87 | Temp 97.9°F | Resp 16 | Ht 68.0 in | Wt 205.0 lb

## 2024-05-30 DIAGNOSIS — Z01818 Encounter for other preprocedural examination: Secondary | ICD-10-CM | POA: Insufficient documentation

## 2024-05-30 DIAGNOSIS — I10 Essential (primary) hypertension: Secondary | ICD-10-CM | POA: Insufficient documentation

## 2024-05-30 HISTORY — DX: Unspecified asthma, uncomplicated: J45.909

## 2024-05-30 LAB — CBC
HCT: 39 % (ref 39.0–52.0)
Hemoglobin: 13.7 g/dL (ref 13.0–17.0)
MCH: 35.6 pg — ABNORMAL HIGH (ref 26.0–34.0)
MCHC: 35.1 g/dL (ref 30.0–36.0)
MCV: 101.3 fL — ABNORMAL HIGH (ref 80.0–100.0)
Platelets: 142 K/uL — ABNORMAL LOW (ref 150–400)
RBC: 3.85 MIL/uL — ABNORMAL LOW (ref 4.22–5.81)
RDW: 12 % (ref 11.5–15.5)
WBC: 5 K/uL (ref 4.0–10.5)
nRBC: 0 % (ref 0.0–0.2)

## 2024-05-30 LAB — BASIC METABOLIC PANEL WITH GFR
Anion gap: 13 (ref 5–15)
BUN: 15 mg/dL (ref 8–23)
CO2: 24 mmol/L (ref 22–32)
Calcium: 11.3 mg/dL — ABNORMAL HIGH (ref 8.9–10.3)
Chloride: 98 mmol/L (ref 98–111)
Creatinine, Ser: 0.76 mg/dL (ref 0.61–1.24)
GFR, Estimated: >60 mL/min (ref >60)
Glucose, Bld: 115 mg/dL — ABNORMAL HIGH (ref 70–99)
Potassium: 4.4 mmol/L (ref 3.5–5.1)
Sodium: 135 mmol/L (ref 135–145)

## 2024-05-30 LAB — SURGICAL PCR SCREEN
MRSA, PCR: NEGATIVE
Staphylococcus aureus: POSITIVE — AB

## 2024-05-30 NOTE — Progress Notes (Signed)
 [facility] + results routed to Dr. Kay. ?

## 2024-06-07 ENCOUNTER — Other Ambulatory Visit: Payer: Self-pay

## 2024-06-07 ENCOUNTER — Encounter (HOSPITAL_COMMUNITY)
Admission: RE | Disposition: A | Discharge status: Home / Self Care | Payer: Self-pay | Source: Home / Self Care | Attending: Orthopedic Surgery

## 2024-06-07 ENCOUNTER — Observation Stay (HOSPITAL_COMMUNITY)

## 2024-06-07 ENCOUNTER — Ambulatory Visit (HOSPITAL_COMMUNITY)

## 2024-06-07 ENCOUNTER — Ambulatory Visit (HOSPITAL_COMMUNITY): Payer: Self-pay | Admitting: Physician Assistant

## 2024-06-07 ENCOUNTER — Observation Stay (HOSPITAL_COMMUNITY)
Admission: RE | Admit: 2024-06-07 | Discharge: 2024-06-08 | Disposition: A | Discharge status: Home / Self Care | Attending: Orthopedic Surgery | Admitting: Orthopedic Surgery

## 2024-06-07 ENCOUNTER — Encounter (HOSPITAL_COMMUNITY): Payer: Self-pay | Admitting: Orthopedic Surgery

## 2024-06-07 DIAGNOSIS — I10 Essential (primary) hypertension: Secondary | ICD-10-CM | POA: Diagnosis not present

## 2024-06-07 DIAGNOSIS — Z79899 Other long term (current) drug therapy: Secondary | ICD-10-CM | POA: Diagnosis not present

## 2024-06-07 DIAGNOSIS — J45909 Unspecified asthma, uncomplicated: Secondary | ICD-10-CM | POA: Insufficient documentation

## 2024-06-07 DIAGNOSIS — M25511 Pain in right shoulder: Secondary | ICD-10-CM | POA: Diagnosis present

## 2024-06-07 DIAGNOSIS — M19011 Primary osteoarthritis, right shoulder: Secondary | ICD-10-CM | POA: Diagnosis not present

## 2024-06-07 DIAGNOSIS — Z96611 Presence of right artificial shoulder joint: Principal | ICD-10-CM

## 2024-06-07 DIAGNOSIS — Z87891 Personal history of nicotine dependence: Secondary | ICD-10-CM

## 2024-06-07 DIAGNOSIS — G4733 Obstructive sleep apnea (adult) (pediatric): Secondary | ICD-10-CM | POA: Diagnosis not present

## 2024-06-07 HISTORY — PX: REVERSE SHOULDER ARTHROPLASTY: SHX5054

## 2024-06-07 SURGERY — ARTHROPLASTY, SHOULDER, TOTAL, REVERSE
Anesthesia: Regional | Site: Shoulder | Laterality: Right

## 2024-06-07 MED ORDER — BUPIVACAINE LIPOSOME 1.3 % IJ SUSP
INTRAMUSCULAR | Status: DC | PRN
Start: 2024-06-07 — End: 2024-06-07
  Administered 2024-06-07: 10 mL via PERINEURAL

## 2024-06-07 MED ORDER — COENZYME Q10 200 MG PO CAPS: 200.0000 mg | ORAL_CAPSULE | Freq: Every day | ORAL | Status: DC

## 2024-06-07 MED ORDER — TRAMADOL HCL 50 MG PO TABS
50.0000 mg | ORAL_TABLET | Freq: Four times a day (QID) | ORAL | Status: DC | PRN
  Administered 2024-06-07 – 2024-06-08 (×3): 50 mg via ORAL
  Filled 2024-06-07 (×3): qty 1

## 2024-06-07 MED ORDER — TRANEXAMIC ACID-NACL 1000-0.7 MG/100ML-% IV SOLN
1000.0000 mg | INTRAVENOUS | Status: AC
  Administered 2024-06-07: 1000 mg via INTRAVENOUS
  Filled 2024-06-07: qty 100

## 2024-06-07 MED ORDER — ONDANSETRON HCL 4 MG/2ML IJ SOLN
INTRAMUSCULAR | Status: AC
  Filled 2024-06-07: qty 2

## 2024-06-07 MED ORDER — DOCUSATE SODIUM 100 MG PO CAPS
100.0000 mg | ORAL_CAPSULE | Freq: Two times a day (BID) | ORAL | Status: DC
Start: 2024-06-07 — End: 2024-06-08
  Administered 2024-06-08: 100 mg via ORAL
  Filled 2024-06-07: qty 1

## 2024-06-07 MED ORDER — MORPHINE SULFATE (PF) 2 MG/ML IV SOLN: 0.5000 mg | INTRAVENOUS | Status: DC | PRN

## 2024-06-07 MED ORDER — PROPOFOL 10 MG/ML IV BOLUS
INTRAVENOUS | Status: AC
  Filled 2024-06-07: qty 20

## 2024-06-07 MED ORDER — POLYETHYLENE GLYCOL 3350 17 G PO PACK: 17.0000 g | PACK | Freq: Every day | ORAL | Status: DC | PRN

## 2024-06-07 MED ORDER — METHOCARBAMOL 500 MG PO TABS: 500.0000 mg | ORAL_TABLET | Freq: Three times a day (TID) | ORAL | 1 refills | Status: AC | PRN

## 2024-06-07 MED ORDER — ONDANSETRON HCL 4 MG PO TABS: 4.0000 mg | ORAL_TABLET | Freq: Four times a day (QID) | ORAL | Status: DC | PRN

## 2024-06-07 MED ORDER — CHLORHEXIDINE GLUCONATE 0.12 % MT SOLN
15.0000 mL | Freq: Once | OROMUCOSAL | Status: AC
Start: 2024-06-07 — End: 2024-06-07
  Administered 2024-06-07: 15 mL via OROMUCOSAL

## 2024-06-07 MED ORDER — TRAMADOL HCL 50 MG PO TABS: 50.0000 mg | ORAL_TABLET | Freq: Four times a day (QID) | ORAL | 0 refills | Status: AC | PRN

## 2024-06-07 MED ORDER — PROPOFOL 10 MG/ML IV BOLUS
INTRAVENOUS | Status: DC | PRN
  Administered 2024-06-07: 150 mg via INTRAVENOUS
  Administered 2024-06-07: 50 mg via INTRAVENOUS
  Administered 2024-06-07: 20 mg via INTRAVENOUS

## 2024-06-07 MED ORDER — PHENYLEPHRINE HCL-NACL 20-0.9 MG/250ML-% IV SOLN
INTRAVENOUS | Status: DC | PRN
  Administered 2024-06-07: 120 ug/min via INTRAVENOUS

## 2024-06-07 MED ORDER — SODIUM CHLORIDE 0.9 % IV SOLN: INTRAVENOUS | Status: DC

## 2024-06-07 MED ORDER — SUGAMMADEX SODIUM 200 MG/2ML IV SOLN
INTRAVENOUS | Status: DC | PRN
  Administered 2024-06-07: 200 mg via INTRAVENOUS

## 2024-06-07 MED ORDER — ROCURONIUM BROMIDE 10 MG/ML (PF) SYRINGE
PREFILLED_SYRINGE | INTRAVENOUS | Status: DC | PRN
  Administered 2024-06-07: 40 mg via INTRAVENOUS

## 2024-06-07 MED ORDER — EPHEDRINE 5 MG/ML INJ
INTRAVENOUS | Status: AC
Start: 2024-06-07 — End: 2024-06-07
  Filled 2024-06-07: qty 5

## 2024-06-07 MED ORDER — FENTANYL CITRATE (PF) 50 MCG/ML IJ SOSY
PREFILLED_SYRINGE | INTRAMUSCULAR | Status: AC
  Filled 2024-06-07: qty 1

## 2024-06-07 MED ORDER — ORAL CARE MOUTH RINSE: 15.0000 mL | Freq: Once | OROMUCOSAL | Status: AC

## 2024-06-07 MED ORDER — STERILE WATER FOR IRRIGATION IR SOLN
Status: DC | PRN
  Administered 2024-06-07: 2000 mL

## 2024-06-07 MED ORDER — FENTANYL CITRATE (PF) 100 MCG/2ML IJ SOLN
INTRAMUSCULAR | Status: AC
  Filled 2024-06-07: qty 2

## 2024-06-07 MED ORDER — MENTHOL 3 MG MT LOZG: 1.0000 | LOZENGE | OROMUCOSAL | Status: DC | PRN

## 2024-06-07 MED ORDER — MUPIROCIN 2 % EX OINT: 1.0000 | TOPICAL_OINTMENT | Freq: Two times a day (BID) | CUTANEOUS | 0 refills | Status: AC

## 2024-06-07 MED ORDER — FENTANYL CITRATE (PF) 100 MCG/2ML IJ SOLN
INTRAMUSCULAR | Status: DC | PRN
  Administered 2024-06-07 (×4): 50 ug via INTRAVENOUS

## 2024-06-07 MED ORDER — LACTATED RINGERS IV SOLN: INTRAVENOUS | Status: DC

## 2024-06-07 MED ORDER — OXYCODONE HCL 5 MG PO TABS
ORAL_TABLET | ORAL | Status: AC
  Filled 2024-06-07: qty 1

## 2024-06-07 MED ORDER — METHOCARBAMOL 1000 MG/10ML IJ SOLN: 500.0000 mg | Freq: Four times a day (QID) | INTRAMUSCULAR | Status: DC | PRN

## 2024-06-07 MED ORDER — ACETAMINOPHEN 325 MG PO TABS: 325.0000 mg | ORAL_TABLET | Freq: Four times a day (QID) | ORAL | Status: DC | PRN

## 2024-06-07 MED ORDER — EPHEDRINE SULFATE-NACL 50-0.9 MG/10ML-% IV SOSY
PREFILLED_SYRINGE | INTRAVENOUS | Status: DC | PRN
  Administered 2024-06-07: 5 mg via INTRAVENOUS
  Administered 2024-06-07: 10 mg via INTRAVENOUS

## 2024-06-07 MED ORDER — IBUPROFEN 200 MG PO TABS: 400.0000 mg | ORAL_TABLET | Freq: Three times a day (TID) | ORAL | Status: DC | PRN

## 2024-06-07 MED ORDER — FENTANYL CITRATE (PF) 50 MCG/ML IJ SOSY
25.0000 ug | PREFILLED_SYRINGE | INTRAMUSCULAR | Status: DC | PRN
  Administered 2024-06-07 (×3): 50 ug via INTRAVENOUS

## 2024-06-07 MED ORDER — ROCURONIUM BROMIDE 10 MG/ML (PF) SYRINGE
PREFILLED_SYRINGE | INTRAVENOUS | Status: AC
  Filled 2024-06-07: qty 10

## 2024-06-07 MED ORDER — CEFAZOLIN SODIUM-DEXTROSE 2-4 GM/100ML-% IV SOLN
2.0000 g | INTRAVENOUS | Status: AC
  Administered 2024-06-07: 2 g via INTRAVENOUS
  Filled 2024-06-07: qty 100

## 2024-06-07 MED ORDER — ATORVASTATIN CALCIUM 20 MG PO TABS: 20.0000 mg | ORAL_TABLET | Freq: Every day | ORAL | Status: DC

## 2024-06-07 MED ORDER — ACETAMINOPHEN 10 MG/ML IV SOLN
INTRAVENOUS | Status: AC
  Filled 2024-06-07: qty 100

## 2024-06-07 MED ORDER — DROPERIDOL 2.5 MG/ML IJ SOLN: 0.6250 mg | Freq: Once | INTRAMUSCULAR | Status: DC | PRN

## 2024-06-07 MED ORDER — BUPIVACAINE HCL (PF) 0.5 % IJ SOLN
INTRAMUSCULAR | Status: DC | PRN
  Administered 2024-06-07: 10 mL via PERINEURAL

## 2024-06-07 MED ORDER — BUPIVACAINE-EPINEPHRINE (PF) 0.25% -1:200000 IJ SOLN
INTRAMUSCULAR | Status: AC
  Filled 2024-06-07: qty 30

## 2024-06-07 MED ORDER — VALSARTAN-HYDROCHLOROTHIAZIDE 80-12.5 MG PO TABS: 1.0000 | ORAL_TABLET | Freq: Every day | ORAL | Status: DC

## 2024-06-07 MED ORDER — TRANEXAMIC ACID-NACL 1000-0.7 MG/100ML-% IV SOLN
1000.0000 mg | Freq: Once | INTRAVENOUS | Status: AC
  Administered 2024-06-07: 1000 mg via INTRAVENOUS
  Filled 2024-06-07: qty 100

## 2024-06-07 MED ORDER — 0.9 % SODIUM CHLORIDE (POUR BTL) OPTIME
TOPICAL | Status: DC | PRN
  Administered 2024-06-07: 1000 mL

## 2024-06-07 MED ORDER — SUGAMMADEX SODIUM 200 MG/2ML IV SOLN
INTRAVENOUS | Status: AC
  Filled 2024-06-07: qty 2

## 2024-06-07 MED ORDER — PHENOL 1.4 % MT LIQD: 1.0000 | OROMUCOSAL | Status: DC | PRN

## 2024-06-07 MED ORDER — FENTANYL CITRATE (PF) 50 MCG/ML IJ SOSY
50.0000 ug | PREFILLED_SYRINGE | Freq: Once | INTRAMUSCULAR | Status: AC
  Administered 2024-06-07: 50 ug via INTRAVENOUS
  Filled 2024-06-07: qty 2

## 2024-06-07 MED ORDER — DEXAMETHASONE SOD PHOSPHATE PF 10 MG/ML IJ SOLN
INTRAMUSCULAR | Status: DC | PRN
  Administered 2024-06-07: 5 mg via INTRAVENOUS

## 2024-06-07 MED ORDER — OXYCODONE HCL 5 MG/5ML PO SOLN: 5.0000 mg | Freq: Once | ORAL | Status: AC | PRN

## 2024-06-07 MED ORDER — METHOCARBAMOL 500 MG PO TABS
500.0000 mg | ORAL_TABLET | Freq: Four times a day (QID) | ORAL | Status: DC | PRN
  Administered 2024-06-07 – 2024-06-08 (×2): 500 mg via ORAL
  Filled 2024-06-07: qty 1

## 2024-06-07 MED ORDER — PHENYLEPHRINE HCL (PRESSORS) 10 MG/ML IV SOLN
INTRAVENOUS | Status: AC
  Filled 2024-06-07: qty 1

## 2024-06-07 MED ORDER — MIDAZOLAM HCL (PF) 2 MG/2ML IJ SOLN
1.0000 mg | Freq: Once | INTRAMUSCULAR | Status: DC
  Filled 2024-06-07: qty 2

## 2024-06-07 MED ORDER — CHLORHEXIDINE GLUCONATE 4 % EX SOLN: 1.0000 | CUTANEOUS | 1 refills | Status: AC

## 2024-06-07 MED ORDER — METOCLOPRAMIDE HCL 5 MG/ML IJ SOLN: 5.0000 mg | Freq: Three times a day (TID) | INTRAMUSCULAR | Status: DC | PRN

## 2024-06-07 MED ORDER — ONDANSETRON HCL 4 MG/2ML IJ SOLN: 4.0000 mg | Freq: Four times a day (QID) | INTRAMUSCULAR | Status: DC | PRN

## 2024-06-07 MED ORDER — CEFAZOLIN SODIUM-DEXTROSE 2-4 GM/100ML-% IV SOLN
2.0000 g | Freq: Four times a day (QID) | INTRAVENOUS | Status: AC
  Administered 2024-06-07 – 2024-06-08 (×2): 2 g via INTRAVENOUS
  Filled 2024-06-07 (×2): qty 100

## 2024-06-07 MED ORDER — BUPIVACAINE-EPINEPHRINE (PF) 0.25% -1:200000 IJ SOLN
INTRAMUSCULAR | Status: DC | PRN
  Administered 2024-06-07: 16 mL

## 2024-06-07 MED ORDER — VANCOMYCIN HCL 1000 MG IV SOLR
INTRAVENOUS | Status: AC
  Filled 2024-06-07: qty 20

## 2024-06-07 MED ORDER — ONDANSETRON HCL 4 MG/2ML IJ SOLN
INTRAMUSCULAR | Status: DC | PRN
  Administered 2024-06-07: 4 mg via INTRAVENOUS

## 2024-06-07 MED ORDER — OXYCODONE HCL 5 MG PO TABS
5.0000 mg | ORAL_TABLET | Freq: Once | ORAL | Status: AC | PRN
  Administered 2024-06-07: 5 mg via ORAL

## 2024-06-07 MED ORDER — IRBESARTAN 75 MG PO TABS: 75.0000 mg | ORAL_TABLET | Freq: Every day | ORAL | Status: DC

## 2024-06-07 MED ORDER — HYDROCHLOROTHIAZIDE 12.5 MG PO TABS: 12.5000 mg | ORAL_TABLET | Freq: Every day | ORAL | Status: DC

## 2024-06-07 MED ORDER — METOCLOPRAMIDE HCL 5 MG PO TABS: 5.0000 mg | ORAL_TABLET | Freq: Three times a day (TID) | ORAL | Status: DC | PRN

## 2024-06-07 MED ORDER — ACETAMINOPHEN 10 MG/ML IV SOLN
1000.0000 mg | Freq: Once | INTRAVENOUS | Status: DC | PRN
  Administered 2024-06-07: 1000 mg via INTRAVENOUS

## 2024-06-07 MED ORDER — METHOCARBAMOL 500 MG PO TABS
ORAL_TABLET | ORAL | Status: AC
  Filled 2024-06-07: qty 1

## 2024-06-07 MED ORDER — VANCOMYCIN HCL 1 G IV SOLR
INTRAVENOUS | Status: DC | PRN
  Administered 2024-06-07: 1000 mg via TOPICAL

## 2024-06-07 MED ORDER — ADULT MULTIVITAMIN W/MINERALS CH: 1.0000 | ORAL_TABLET | Freq: Every day | ORAL | Status: DC

## 2024-06-07 SURGICAL SUPPLY — 61 items
BAG COUNTER SPONGE SURGICOUNT (BAG) ×1 IMPLANT
BAG ZIPLOCK 12X15 (MISCELLANEOUS) IMPLANT
BIT DRILL 1.6MX128 (BIT) IMPLANT
BIT DRILL 170X2.5X (BIT) IMPLANT
BLADE SAG 18X100X1.27 (BLADE) ×1 IMPLANT
COVER BACK TABLE 60X90IN (DRAPES) ×1 IMPLANT
COVER SURGICAL LIGHT HANDLE (MISCELLANEOUS) ×1 IMPLANT
CUP HUMERAL 42 PLUS 3 (Orthopedic Implant) IMPLANT
DRAPE INCISE IOBAN 66X45 STRL (DRAPES) ×1 IMPLANT
DRAPE POUCH INSTRU U-SHP 10X18 (DRAPES) ×1 IMPLANT
DRAPE SHEET LG 3/4 BI-LAMINATE (DRAPES) ×1 IMPLANT
DRAPE SURG ORHT 6 SPLT 77X108 (DRAPES) ×2 IMPLANT
DRAPE TOP 10253 STERILE (DRAPES) ×1 IMPLANT
DRAPE U-SHAPE 47X51 STRL (DRAPES) ×1 IMPLANT
DRSG ADAPTIC 3X8 NADH LF (GAUZE/BANDAGES/DRESSINGS) ×1 IMPLANT
DRSG AQUACEL AG ADV 3.5X10 (GAUZE/BANDAGES/DRESSINGS) ×1 IMPLANT
DURAPREP 26ML APPLICATOR (WOUND CARE) ×1 IMPLANT
ELECT BLADE TIP CTD 4 INCH (ELECTRODE) ×1 IMPLANT
ELECT NDL TIP 2.8 STRL (NEEDLE) ×1 IMPLANT
ELECT NEEDLE TIP 2.8 STRL (NEEDLE) ×1 IMPLANT
ELECT PENCIL ROCKER SW 15FT (MISCELLANEOUS) ×1 IMPLANT
ELECT REM PT RETURN 15FT ADLT (MISCELLANEOUS) ×1 IMPLANT
EPIPHYSIS CENT SZ 2 (Orthopedic Implant) IMPLANT
FACESHIELD WRAPAROUND OR TEAM (MASK) ×1 IMPLANT
GAUZE PAD ABD 8X10 STRL (GAUZE/BANDAGES/DRESSINGS) ×1 IMPLANT
GAUZE SPONGE 4X4 12PLY STRL (GAUZE/BANDAGES/DRESSINGS) ×1 IMPLANT
GLENOSPHERE XTEND LAT 42+0 STD (Miscellaneous) IMPLANT
GLOVE BIOGEL PI IND STRL 7.5 (GLOVE) ×1 IMPLANT
GLOVE BIOGEL PI IND STRL 8.5 (GLOVE) ×1 IMPLANT
GLOVE ORTHO TXT STRL SZ7.5 (GLOVE) ×1 IMPLANT
GLOVE SURG ORTHO 8.5 STRL (GLOVE) ×1 IMPLANT
GOWN STRL REUS W/ TWL XL LVL3 (GOWN DISPOSABLE) ×2 IMPLANT
KIT BASIN OR (CUSTOM PROCEDURE TRAY) ×1 IMPLANT
KIT TURNOVER KIT A (KITS) ×1 IMPLANT
MANIFOLD NEPTUNE II (INSTRUMENTS) ×1 IMPLANT
METAGLENE DELTA EXTEND (Trauma) IMPLANT
NDL MAYO CATGUT SZ4 TPR NDL (NEEDLE) IMPLANT
NEEDLE MAYO CATGUT SZ4 (NEEDLE) IMPLANT
NS IRRIG 1000ML POUR BTL (IV SOLUTION) ×1 IMPLANT
PACK SHOULDER (CUSTOM PROCEDURE TRAY) ×1 IMPLANT
PIN GUIDE 1.2 (PIN) IMPLANT
PIN GUIDE GLENOPHERE 1.5MX300M (PIN) IMPLANT
PIN METAGLENE 2.5 (PIN) IMPLANT
PIN STEINMAN FIXATION KNEE (PIN) IMPLANT
RESTRAINT HEAD UNIVERSAL NS (MISCELLANEOUS) ×1 IMPLANT
SCREW 4.5X36MM (Screw) IMPLANT
SCREW BN 24X4.5XLCK STRL (Screw) IMPLANT
SCREW LOCK 42 (Screw) IMPLANT
SLING ARM FOAM STRAP LRG (SOFTGOODS) IMPLANT
SPIKE FLUID TRANSFER (MISCELLANEOUS) ×1 IMPLANT
STEM 12 HA (Stem) IMPLANT
STRIP CLOSURE SKIN 1/2X4 (GAUZE/BANDAGES/DRESSINGS) ×1 IMPLANT
SUT MNCRL AB 4-0 PS2 18 (SUTURE) ×1 IMPLANT
SUT VIC AB 0 CT1 36 (SUTURE) ×1 IMPLANT
SUT VIC AB 0 CT2 27 (SUTURE) ×1 IMPLANT
SUT VIC AB 2-0 CT1 TAPERPNT 27 (SUTURE) ×1 IMPLANT
SUTURE FIBERWR #2 38 T-5 BLUE (SUTURE) ×2 IMPLANT
SUTURE FIBERWR#2 38 REV NDL BL (SUTURE) IMPLANT
TOWEL GREEN STERILE FF (TOWEL DISPOSABLE) ×1 IMPLANT
TOWEL OR 17X26 10 PK STRL BLUE (TOWEL DISPOSABLE) ×1 IMPLANT
YANKAUER SUCT BULB TIP NO VENT (SUCTIONS) ×1 IMPLANT

## 2024-06-07 NOTE — Anesthesia Preprocedure Evaluation (Addendum)
 Anesthesia Evaluation  Patient identified by MRN, date of birth, ID band Patient awake    Reviewed: Allergy & Precautions, H&P , NPO status , Patient's Chart, lab work & pertinent test results  History of Anesthesia Complications Negative for: history of anesthetic complications  Airway Mallampati: II  TM Distance: >3 FB Neck ROM: Full    Dental no notable dental hx.    Pulmonary asthma , sleep apnea , former smoker   Pulmonary exam normal breath sounds clear to auscultation       Cardiovascular hypertension, (-) angina + Peripheral Vascular Disease  (-) Past MI Normal cardiovascular exam Rhythm:Regular Rate:Normal     Neuro/Psych neg Seizures negative neurological ROS  negative psych ROS   GI/Hepatic negative GI ROS, Neg liver ROS,neg GERD  ,,  Endo/Other  negative endocrine ROS    Renal/GU Renal artery stenosis, s/p stent   negative genitourinary   Musculoskeletal  (+) Arthritis ,    Abdominal   Peds negative pediatric ROS (+)  Hematology negative hematology ROS (+)   Anesthesia Other Findings   Reproductive/Obstetrics negative OB ROS                              Anesthesia Physical Anesthesia Plan  ASA: 3  Anesthesia Plan: General and Regional   Post-op Pain Management: Ofirmev  IV (intra-op)*   Induction: Intravenous  PONV Risk Score and Plan: 2 and Ondansetron  and Dexamethasone   Airway Management Planned: Oral ETT  Additional Equipment: None  Intra-op Plan:   Post-operative Plan: Extubation in OR  Informed Consent: I have reviewed the patients History and Physical, chart, labs and discussed the procedure including the risks, benefits and alternatives for the proposed anesthesia with the patient or authorized representative who has indicated his/her understanding and acceptance.       Plan Discussed with: CRNA  Anesthesia Plan Comments:           Anesthesia Quick Evaluation

## 2024-06-07 NOTE — Op Note (Signed)
 NAME: CHRISTINA, WALDROP MEDICAL RECORD NO: [id number] ACCOUNT NO: [id number] DATE OF BIRTH: 07/24/1946 FACILITY: [facility]ATION: [id number] PHYSICIAN: Elspeth SAUNDERS. Kay, MD  Operative Report   DATE OF PROCEDURE: 06/07/2024   PREOPERATIVE DIAGNOSIS: Right shoulder end-stage arthritis.  POSTOPERATIVE DIAGNOSIS: Right shoulder end-stage arthritis.  PROCEDURE PERFORMED: Right reverse total shoulder arthroplasty using DePuy Delta Xtend prosthesis with no subscapularis repair.  ATTENDING SURGEON: Elspeth SAUNDERS. Kay, MD  ASSISTANT: Debby Crock [place], [place], who was scrubbed during the entire procedure, and necessary for satisfactory completion of surgery.  ANESTHESIA: General anesthesia plus interscalene block.  ESTIMATED BLOOD LOSS: 150 mL.  FLUID REPLACEMENT: 1000 mL crystalloid.  COUNTS: Instrument count was correct.  COMPLICATIONS: No complications.  ANTIBIOTICS: Perioperative antibiotics were given.  INDICATIONS: The patient is a 78 year old male who presents with worsening right shoulder pain and dysfunction secondary to end-stage osteoarthritis, bone-on-bone. The patient has failed conservative management and presents for operative treatment to  eliminate pain and to restore function. Informed consent obtained.  DESCRIPTION OF PROCEDURE: After an adequate level of general anesthesia was achieved, the patient was positioned in the modified beach chair position. Right shoulder was correctly identified and sterile prep and drape performed. Timeout called verified  correct patient and correct site. We entered the patient's shoulder using a standard deltopectoral incision starting at the coracoid process and then extending down the anterior humerus. Dissection was carried down through the subcutaneous tissues using  Bovie. The cephalic vein was identified and taken laterally with the deltoid. The pectoralis was taken medially. The conjoined tendon was identified and retracted medially. A deep  retractor was placed. We identified the biceps tendon and tenodesed the  biceps in situ with a #0 Vicryl figure-of-eight suture x2 incorporating part of the pectoralis tendon. We then released the subscapularis subperiosteally off the lesser tuberosity. The subscapularis was tight and was attenuated superiorly. We decided not  to repair it, but we did want to tag it for protection of the axillary nerve. We then released the inferior capsule, progressively externally rotating and extending the shoulder, delivering the humeral head out of the wound. Once we had the humerus  delivered, we noted there to be no cartilage on the humerus whatsoever with eburnated bone. We entered the proximal humerus with a 6 mm reamer, reaming up to a size 12. We then used a 12-mm T-handle guide and resected the head at 20 degrees of  retroversion with the oscillating saw. We removed excess osteophytes with a rongeur. We then subluxed the humerus posteriorly, getting good exposure of the glenoid. We removed the capsule, the labrum, and the biceps stump. We had placed our deep  retractors. We then found our center point for a guide pin. The shoulder was retroverted, and we were basically going to ream down the high side to correct version. We placed our guide pin bicortically. We then reamed down to subchondral bone. We were  able to get good coverage superiorly, anteriorly, and inferiorly. We felt like we would probably have the baseplate off slightly on the back, which we eventually did. Once we had our reaming done to where we were satisfied and it was centered low on the  glenoid, we went ahead and did the peripheral T-handle reamer for the glenosphere. We then drilled out our central peg hole. We then irrigated and then impacted the HA-coated press-fit baseplate in position. We had good bony support for about 300 degrees  of it, just the posterior portion that was off maybe 1 or  2 mm. With the baseplate seated, we placed a 42  screw inferiorly, a 36 screw superiorly, locking both of those, and then a 24 screw anteriorly, locking that. The baseplate was very stable and  secured with good support. We then selected a 42 +0 standard glenosphere and attached that to the baseplate with the screwdriver. Once that was on, I did a finger sweep to make sure we had no soft tissue caught up between the baseplate and the  glenosphere. We went to the humeral side and reamed for the two-centered metaphysis. We then trialled the 12 stem and the two-centered metaphysis set in the 0 setting and placed in 20 degrees of retroversion. We reduced the shoulder with a 42 +3 poly  trial. We were happy with that soft tissue balancing, tension, stability, and range of motion. We then removed the trial components from the humeral side. We irrigated thoroughly. I used available bone graft from the humeral head and impaction grafting  technique. We then press-fit the HA-coated press-fit 12 stem and the two-centered metaphysis set in the 0 setting. We impacted that in 20 degrees of retroversion with bone graft. We also placed some vancomycin powder in the canal. With that stem in place  and at the appropriate height, we went ahead and selected the real 42 +3 poly, impacted it on the humeral tray, and reduced the shoulder. A nice little pop was felt as it was reduced. Appropriate conjoint tension. No gapping with inferior pull or  external rotation and excellent motion with no impingement. We irrigated again. We then placed the remainder of 1 g of vancomycin powder into the wound deep. We then repaired the deltopectoral interval with #0 Vicryl suture followed by 2-0 Vicryl for  subcutaneous closure and 4-0 running Monocryl for skin. Steri-Strips were applied followed by a sterile dressing. The patient tolerated the surgery well.    SUJ D: 06/07/2024 3:14:50 pm T: 06/07/2024 9:34:00 pm  JOB: [id number]/ [id number]

## 2024-06-07 NOTE — Transfer of Care (Signed)
 Immediate Anesthesia Transfer of Care Note  Patient: Aaron Kirk  Procedure(s) Performed: Procedure(s): ARTHROPLASTY, SHOULDER, TOTAL, REVERSE (Right)  Patient Location: PACU  Anesthesia Type:General  Level of Consciousness:  sedated, patient cooperative and responds to stimulation  Airway & Oxygen Therapy:Patient Spontanous Breathing and Patient connected to face mask oxgen  Post-op Assessment:  Report given to PACU RN and Post -op Vital signs reviewed and stable  Post vital signs:  Reviewed and stable  Last Vitals:  Vitals:   06/07/24 1230 06/07/24 1505  BP: (!) 175/101 (!) 132/90  Pulse: 80 77  Resp: 19 10  Temp:  36.4 C  SpO2: 92% 98%    Complications: No apparent anesthesia complications

## 2024-06-07 NOTE — Anesthesia Postprocedure Evaluation (Signed)
 Anesthesia Post Note  Patient: Aaron Kirk  Procedure(s) Performed: ARTHROPLASTY, SHOULDER, TOTAL, REVERSE (Right: Shoulder)     Patient location during evaluation: PACU Anesthesia Type: Regional and General Level of consciousness: awake and alert Pain management: pain level controlled Vital Signs Assessment: post-procedure vital signs reviewed and stable Respiratory status: spontaneous breathing, nonlabored ventilation, respiratory function stable and patient connected to nasal cannula oxygen Cardiovascular status: blood pressure returned to baseline and stable Postop Assessment: no apparent nausea or vomiting Anesthetic complications: no   No notable events documented.  Last Vitals:  Vitals:   06/07/24 1615 06/07/24 1630  BP: 121/86 (!) 152/95  Pulse: 71 69  Resp: 15 16  Temp:    SpO2: 97% 97%    Last Pain:  Vitals:   06/07/24 1630  TempSrc:   PainSc: 4                  Thom JONELLE Peoples

## 2024-06-07 NOTE — Anesthesia Procedure Notes (Signed)
 Procedure Name: Intubation Date/Time: 06/07/2024 1:06 PM  Performed by: Vincenzo Show, CRNAPre-anesthesia Checklist: Patient identified, Emergency Drugs available, Suction available, Patient being monitored and Timeout performed Patient Re-evaluated:Patient Re-evaluated prior to induction Oxygen Delivery Method: Circle system utilized Preoxygenation: Pre-oxygenation with 100% oxygen Induction Type: IV induction Ventilation: Mask ventilation without difficulty and Oral airway inserted - appropriate to patient size Laryngoscope Size: Mac and 4 Grade View: Grade I Tube type: Oral Tube size: 7.5 mm Number of attempts: 1 Airway Equipment and Method: Stylet Placement Confirmation: ETT inserted through vocal cords under direct vision, positive ETCO2, CO2 detector and breath sounds checked- equal and bilateral Secured at: 23 cm Tube secured with: Tape Dental Injury: Teeth and Oropharynx as per pre-operative assessment

## 2024-06-07 NOTE — Brief Op Note (Signed)
 06/07/2024  3:07 PM  PATIENT:  Aaron Kirk  78 y.o. male  PRE-OPERATIVE DIAGNOSIS:  Right shoulder end stage OA  POST-OPERATIVE DIAGNOSIS:  Right shoulder end stage OA  PROCEDURE:  Procedure(s): ARTHROPLASTY, SHOULDER, TOTAL, REVERSE (Right) DePuy Delta Xtend with NO subscap repair  SURGEON:  Surgeons and Role:    DEWAINE Kay Kemps, MD - Primary  PHYSICIAN ASSISTANT:   ASSISTANTS: Debby KATHEE Fireman, PA-C   ANESTHESIA:   regional and general  EBL:  150 mL   BLOOD ADMINISTERED:none  DRAINS: none   LOCAL MEDICATIONS USED:  MARCAINE     SPECIMEN:  No Specimen  DISPOSITION OF SPECIMEN:  N/A  COUNTS:  YES  TOURNIQUET:  * No tourniquets in log *  DICTATION: .Other Dictation: Dictation Number [id number]  PLAN OF CARE: Admit for overnight observation  PATIENT DISPOSITION:  PACU - hemodynamically stable.   Delay start of Pharmacological VTE agent (>24hrs) due to surgical blood loss or risk of bleeding: not applicable

## 2024-06-07 NOTE — Anesthesia Procedure Notes (Signed)
 Anesthesia Regional Block: Interscalene brachial plexus block   Pre-Anesthetic Checklist: , timeout performed,  Correct Patient, Correct Site, Correct Laterality,  Correct Procedure, Correct Position, site marked,  Risks and benefits discussed,  Surgical consent,  Pre-op evaluation,  At surgeon's request and post-op pain management  Laterality: Right  Prep: chloraprep       Needles:  Injection technique: Single-shot  Needle Type: Echogenic Stimulator Needle     Needle Length: 9cm  Needle Gauge: 21     Additional Needles:   Procedures:,,,, ultrasound used (permanent image in chart),,    Narrative:  Start time: 06/07/2024 12:30 PM End time: 06/07/2024 12:35 PM Injection made incrementally with aspirations every 5 mL.  Performed by: Personally  Anesthesiologist: Erma Thom SAUNDERS, MD  Additional Notes: Discussed risks and benefits of the nerve block in detail, including but not limited vascular injury, permanent nerve damage and infection.   Patient tolerated the procedure well. Local anesthetic introduced in an incremental fashion under minimal resistance after negative aspirations. No paresthesias were elicited. After completion of the procedure, no acute issues were identified and patient continued to be monitored by RN.

## 2024-06-07 NOTE — Discharge Instructions (Signed)
 Ice to the shoulder constantly.  Keep the incision covered and clean and dry with the Aquacel for one week, then ok to remove the bandage and leave open to air. Ok to shower with the Aquacel in place, it is waterproof.   Do exercise as instructed several times per day.  DO NOT reach behind your back or push up out of a chair with the operative arm.  Use a sling while you are up and around for comfort, may remove while seated.  Keep pillow propped behind the operative elbow.  Follow up with Dr Kay in two weeks in the office, call [phone] for appt  Please call Dr Kay (cell) [phone] with any questions or concerns

## 2024-06-07 NOTE — Interval H&P Note (Signed)
 History and Physical Interval Note:  06/07/2024 12:15 PM  Aaron Kirk  has presented today for surgery, with the diagnosis of Right shoulder pain, unspecified chronicity.  The various methods of treatment have been discussed with the patient and family. After consideration of risks, benefits and other options for treatment, the patient has consented to  Procedure(s): ARTHROPLASTY, SHOULDER, TOTAL, REVERSE (Right) as a surgical intervention.  The patient's history has been reviewed, patient examined, no change in status, stable for surgery.  I have reviewed the patient's chart and labs.  Questions were answered to the patient's satisfaction.     Aaron Kirk

## 2024-06-07 NOTE — Progress Notes (Shared)
 Subjective:  Patient reports pain as appropriately controlled. Denies any new numbness/tingling.   Objective:   VITALS:  Temp:  [97.5 F (36.4 C)-98.5 F (36.9 C)] 98.5 F (36.9 C) (10/31 2304) Pulse Rate:  [64-88] 88 (10/31 2304) Resp:  [10-19] 16 (10/31 2304) BP: (114-183)/(79-101) 140/80 (10/31 2304) SpO2:  [89 %-100 %] 95 % (10/31 2304) Weight:  [93 kg] 93 kg (10/31 1157)  Neurologically intact Neurovascular intact Sensation & motor grossly intact distally Intact pulses distally Compartment soft   LABS No results for input(s): HGB, WBC, PLT in the last 72 hours. No results for input(s): NA, K, CL, CO2, BUN, CREATININE, GLUCOSE in the last 72 hours. No results for input(s): LABPT, INR in the last 72 hours.   Assessment/Plan: * Day of Surgery * Procedure(s) (LRB): ARTHROPLASTY, SHOULDER, TOTAL, REVERSE (Right)  Advance diet Up with therapy Discharge pending PT  Lillia Mountain 06/07/2024, 11:18 PM

## 2024-06-08 DIAGNOSIS — M19011 Primary osteoarthritis, right shoulder: Secondary | ICD-10-CM | POA: Diagnosis not present

## 2024-06-08 NOTE — Progress Notes (Signed)
 Orthopedics Progress Note  Subjective: Patient comfortable this AM on Tramadol  Objective:  Vitals:   06/08/24 0131 06/08/24 0600  BP: 129/82 136/86  Pulse: 81 68  Resp: 16 16  Temp: 97.9 F (36.6 C) 97.8 F (36.6 C)  SpO2: 95% 97%    General: Awake and alert  Musculoskeletal: Right shoulder incision CDI, Aquacel placed Neurovascularly intact  Lab Results  Component Value Date   WBC 5.0 05/30/2024   HGB 13.7 05/30/2024   HCT 39.0 05/30/2024   MCV 101.3 (H) 05/30/2024   PLT 142 (L) 05/30/2024       Component Value Date/Time   NA 135 05/30/2024 1125   K 4.4 05/30/2024 1125   CL 98 05/30/2024 1125   CO2 24 05/30/2024 1125   GLUCOSE 115 (H) 05/30/2024 1125   BUN 15 05/30/2024 1125   CREATININE 0.76 05/30/2024 1125   CALCIUM 11.3 (H) 05/30/2024 1125   GFRNONAA >60 05/30/2024 1125   GFRAA  08/09/2008 1644    >60        The eGFR has been calculated using the MDRD equation. This calculation has not been validated in all clinical situations. eGFR's persistently <60 mL/min signify possible Chronic Kidney Disease.    Lab Results  Component Value Date   INR 1.0 06/30/2022    Assessment/Plan: POD #1 s/p Procedure(s): ARTHROPLASTY, SHOULDER, TOTAL, REVERSE Stable for D/C home after therapy Follow up in two weeks in the office  Elspeth R. Kay, MD 06/08/2024 7:48 AM

## 2024-06-08 NOTE — Evaluation (Signed)
 Occupational Therapy Evaluation Patient Details Name: Aaron Kirk MRN: [id number] DOB: 1946/04/22 Today's Date: 06/08/2024   History of Present Illness   Aaron Kirk is a 78 yr old male who is s/p a R reverse total shoulder arthroplasty on 06-07-24, due to R shoulder end stage arthritis.     Clinical Impressions Pt is s/p shoulder replacement of right dominant upper extremity on 06-07-24. Therapist provided education and instruction to the patient and his family (daughter & son-in-law) with in regards to ROM/exercise protocol, post-op precautions, upper extremity and sling positioning, donning upper extremity clothing, recommendations for bathing while maintaining shoulder precautions, use of ice for pain and edema management, non-weigh-bearing status, sling wear schedule, and correctly  donning/doffing sling. Patient and his family verbalized and demonstrated understanding as needed. Patient required assist to donn his shirt, shorts, and shoes, with instruction provided on compensatory strategies to perform ADLs. Patient to progress rehab of shoulder as recommended by MD.       If plan is discharge home, recommend the following:   Assist for transportation;Help with stairs or ramp for entrance;A little help with bathing/dressing/bathroom     Functional Status Assessment   Patient has had a recent decline in their functional status and demonstrates the ability to make significant improvements in function in a reasonable and predictable amount of time.     Equipment Recommendations   Tub/shower seat     Recommendations for Other Services         Precautions/Restrictions   Precautions Precautions: Shoulder Type of Shoulder Precautions: Sling at all times except ADL/exercise, Non weight bearing, Okay to perform AROM elbow, wrist and hand to tolerance, no ROM of shoulder,  Ok for gentle ADLs Shoulder Interventions: Shoulder sling/immobilizer Precaution Booklet Issued:  Yes (comment) Recall of Precautions/Restrictions: Intact Required Braces or Orthoses: Sling Restrictions Weight Bearing Restrictions Per Provider Order: Yes RUE Weight Bearing Per Provider Order: Non weight bearing     Mobility Bed Mobility Overal bed mobility: Needs Assistance Bed Mobility: Supine to Sit     Supine to sit: Supervision, HOB elevated, Used rails          Transfers Overall transfer level: Needs assistance Equipment used: None Transfers: Sit to/from Stand Sit to Stand: Supervision                  Balance Overall balance assessment: No apparent balance deficits (not formally assessed)            ADL either performed or assessed with clinical judgement   ADL Overall ADL's : Needs assistance/impaired                 Upper Body Dressing : Moderate assistance;Cueing for sequencing;Cueing for compensatory techniques;Sitting Upper Body Dressing Details (indicate cue type and reason): Pt required assist to donn an around back button up shirt seated EOB. Lower Body Dressing: Minimal assistance;Cueing for compensatory techniques;Cueing for sequencing;Sitting/lateral leans;Sit to/from stand Lower Body Dressing Details (indicate cue type and reason): Pt required assist to donn his shorts and slide in shoes.    ADL comment: Per orders, no shoulder ROM, elbow/wrist/hand ROM only. While moving within specified parameters, pt was instructed on recommendations for bathing, how to donn/doff shirt with emphasis on placing operative arm through sleeve first when donning and off last when doffing. Pt was also educated on compensatory strategies for lower body ADLs and strategies to reduce risk of falls. Pt further educated on donning/doffing sling and to wear the sling at all times with  the exception of doing ADLs and exercise, and how to loosen the neck strap of the sling when the operative arm is in a supported position when sitting. Education completed regarding  use of ice pack for pain and edema management, including the importance of using a barrier on the shoulder prior to applying. Pt verbalized/demonstrated understanding.                        Pertinent Vitals/Pain Pain Assessment Pain Assessment: No/denies pain     Extremity/Trunk Assessment Upper Extremity Assessment Upper Extremity Assessment: Right hand dominant   Lower Extremity Assessment Lower Extremity Assessment: Overall WFL for tasks assessed       Communication Communication Communication: No apparent difficulties   Cognition Arousal: Alert Behavior During Therapy: WFL for tasks assessed/performed Cognition: No apparent impairments             OT - Cognition Comments: Oriented x4          Following commands: Intact       Cueing  General Comments   Cueing Techniques: Verbal cues              Home Living Family/patient expects to be discharged to:: Private residence Living Arrangements: Alone   Type of Home: House Home Access: Stairs to enter Secretary/administrator of Steps: 3 Entrance Stairs-Rails: Left;Right Home Layout: One level     Bathroom Shower/Tub: Walk-in shower         Home Equipment: Agricultural Consultant (2 wheels);Cane - single point          Prior Functioning/Environment Prior Level of Function : Independent/Modified Independent;Driving             Mobility Comments: He was independent with ambulation. ADLs Comments: He was independent with ADLs, cooking, and driving. He has hired assistance for cleaning.    OT Problem List: Decreased range of motion;Impaired UE functional use   OT Treatment/Interventions:   N/A     OT Goals(Current goals can be found in the care plan section)   Acute Rehab OT Goals OT Goal Formulation: All assessment and education complete, DC therapy   OT Frequency:   N/A       AM-PAC OT 6 Clicks Daily Activity     Outcome Measure Help from another person eating meals?:  None Help from another person taking care of personal grooming?: None Help from another person toileting, which includes using toliet, bedpan, or urinal?: A Little Help from another person bathing (including washing, rinsing, drying)?: A Little Help from another person to put on and taking off regular upper body clothing?: A Lot Help from another person to put on and taking off regular lower body clothing?: A Little 6 Click Score: 19   End of Session Equipment Utilized During Treatment: Other (comment) Nurse Communication: Other (comment) (shoulder education completed)  Activity Tolerance: Patient tolerated treatment well Patient left: in bed;with call bell/phone within reach;with family/visitor present  OT Visit Diagnosis: Muscle weakness (generalized) (M62.81)                Time: 8953-8889 OT Time Calculation (min): 24 min Charges:  OT General Charges $OT Visit: 1 Visit OT Evaluation $OT Eval Moderate Complexity: 1 Mod OT Treatments $Self Care/Home Management : 8-22 mins    Delanna JINNY Lesches, OTR/L 06/08/2024, 12:31 PM

## 2024-06-09 NOTE — Discharge Summary (Signed)
 In most cases prophylactic antibiotics for Dental procdeures after total joint surgery are not necessary.  Exceptions are as follows:  1. History of prior total joint infection  2. Severely immunocompromised (Organ Transplant, cancer chemotherapy, Rheumatoid biologic meds such as Humera)  3. Poorly controlled diabetes (A1C &gt; 8.0, blood glucose over 200)  If you have one of these conditions, contact your surgeon for an antibiotic prescription, prior to your dental procedure. Orthopedic Discharge Summary        Physician Discharge Summary  Patient ID: Aaron Kirk MRN: [id number] DOB/AGE: 08/14/1945 78 y.o.  Admit date: 06/07/2024 Discharge date: 06/08/2024  Procedures:  Procedure(s) (LRB): ARTHROPLASTY, SHOULDER, TOTAL, REVERSE (Right)  Attending Physician:  Dr. Elspeth Her  Admission Diagnoses:   Right shoulder OA, end stage  Discharge Diagnoses:  same   Past Medical History:  Diagnosis Date   Asthma    as child   Elevated liver function tests 15 years ago   in past, resolved   Erectile dysfunction    Hematoma of abdominal wall    subcutaneous, due to cough 12/09, hockey injury in 2005 as well   Hyperlipidemia    Hypertension    Neck pain    with cervical radiculopathy   OA (osteoarthritis)    left shoulder   RAS (renal artery stenosis)    s/p right renal artery stent 8/10, 7mm x 18 stent   Renal artery stenosis     PCP: [facility], [facility]   Discharged Condition: good  Hospital Course:  Patient underwent the above stated procedure on 06/07/2024. Patient tolerated the procedure well and brought to the recovery room in good condition and subsequently to the floor. Patient had an uncomplicated hospital course and was stable for discharge.   Disposition: Discharge disposition: 01-Home or Self Care      with follow up in 2 weeks    Follow-up Information     Her Kemps, MD. Call in 2 week(s).   Specialty: Orthopedic  Surgery Why: please call the office for a follow up in two weeks [phone] Contact information: [address] [place] [address] [phone]                 Dental Antibiotics:  In most cases prophylactic antibiotics for Dental procdeures after total joint surgery are not necessary.  Exceptions are as follows:  1. History of prior total joint infection  2. Severely immunocompromised (Organ Transplant, cancer chemotherapy, Rheumatoid biologic meds such as Humera)  3. Poorly controlled diabetes (A1C &gt; 8.0, blood glucose over 200)  If you have one of these conditions, contact your surgeon for an antibiotic prescription, prior to your dental procedure.  Discharge Instructions     Call MD / Call 911   Complete by: As directed    If you experience chest pain or shortness of breath, CALL 911 and be transported to the hospital emergency room.  If you develope a fever above 101 F, pus (white drainage) or increased drainage or redness at the wound, or calf pain, call your surgeon's office.   Constipation Prevention   Complete by: As directed    Drink plenty of fluids.  Prune juice may be helpful.  You may use a stool softener, such as Colace (over the counter) 100 mg twice a day.  Use MiraLax (over the counter) for constipation as needed.   Diet - low sodium heart healthy   Complete by: As directed    Increase activity slowly  as tolerated   Complete by: As directed    Post-operative opioid taper instructions:   Complete by: As directed    POST-OPERATIVE OPIOID TAPER INSTRUCTIONS: It is important to wean off of your opioid medication as soon as possible. If you do not need pain medication after your surgery it is ok to stop day one. Opioids include: Codeine, Hydrocodone (Norco, Vicodin), Oxycodone(Percocet, oxycontin) and hydromorphone amongst others.  Long term and even short term use of opiods can cause: Increased pain  response Dependence Constipation Depression Respiratory depression And more.  Withdrawal symptoms can include Flu like symptoms Nausea, vomiting And more Techniques to manage these symptoms Hydrate well Eat regular healthy meals Stay active Use relaxation techniques(deep breathing, meditating, yoga) Do Not substitute Alcohol to help with tapering If you have been on opioids for less than two weeks and do not have pain than it is ok to stop all together.  Plan to wean off of opioids This plan should start within one week post op of your joint replacement. Maintain the same interval or time between taking each dose and first decrease the dose.  Cut the total daily intake of opioids by one tablet each day Next start to increase the time between doses. The last dose that should be eliminated is the evening dose.          Allergies as of 06/08/2024       Reactions   Lisinopril Cough   Tolerates valsartan        Medication List     TAKE these medications    atorvastatin 20 MG tablet Commonly known as: LIPITOR Take 20 mg by mouth daily with lunch.   chlorhexidine  4 % external liquid Commonly known as: HIBICLENS  Apply 15 mLs (1 Application total) topically as directed for 30 doses. Use as directed daily for 5 days every other week for 6 weeks.   Coenzyme Q10 200 MG capsule Take 200 mg by mouth daily with lunch.   ibuprofen 200 MG tablet Commonly known as: ADVIL Take 400-600 mg by mouth every 8 (eight) hours as needed (pain.).   methocarbamol  500 MG tablet Commonly known as: ROBAXIN  Take 1 tablet (500 mg total) by mouth every 8 (eight) hours as needed for muscle spasms.   multivitamin with minerals Tabs tablet Take 1 tablet by mouth daily with lunch.   mupirocin ointment 2 % Commonly known as: BACTROBAN Place 1 Application into the nose 2 (two) times daily for 60 doses. Use as directed 2 times daily for 5 days every other week for 6 weeks.   traMADol 50 MG  tablet Commonly known as: Ultram Take 1 tablet (50 mg total) by mouth every 6 (six) hours as needed for moderate pain (pain score 4-6).   valsartan-hydrochlorothiazide 80-12.5 MG tablet Commonly known as: DIOVAN-HCT Take 1 tablet by mouth daily with lunch.          Signed: Elspeth JONELLE Her 06/09/2024, 7:31 AM  [facility] is now [facility] [address], [address], [place], [place] [address] Phone: [phone] Facebook  Instagram  [employer]

## 2024-06-10 ENCOUNTER — Encounter (HOSPITAL_COMMUNITY): Payer: Self-pay | Admitting: Orthopedic Surgery
